# Patient Record
Sex: Female | Born: 1970 | Race: White | Hispanic: No | Smoking: Never smoker
Health system: Southern US, Community
[De-identification: ages and names within clinical notes are randomized; demographics above are authoritative.]

## PROBLEM LIST (undated history)

## (undated) DIAGNOSIS — I2542 Coronary artery dissection: Secondary | ICD-10-CM

## (undated) HISTORY — DX: Coronary artery dissection: I25.42

---

## 2008-07-01 ENCOUNTER — Emergency Department (HOSPITAL_BASED_OUTPATIENT_CLINIC_OR_DEPARTMENT_OTHER): Admission: EM | Admit: 2008-07-01 | Discharge: 2008-07-01 | Payer: Self-pay | Admitting: Emergency Medicine

## 2008-07-01 ENCOUNTER — Ambulatory Visit: Payer: Self-pay | Admitting: Radiology

## 2010-05-28 IMAGING — CT CT HEAD W/O CM
2 series · 16 of 30 positions shown, 18 images · non-contrast
Comparison: No priors

CLINICAL DATA: Syncope

CT HEAD WITHOUT CONTRAST
TECHNIQUE: Contiguous axial images were obtained from the base of
the skull through the vertex without contrast.

[Series 2: head 4.8 h37s · axial · 0.41mm/px · z∈[+1282,+1396]mm · 8 of 32 slices shown, 10 images]
[im 4/32  brain]
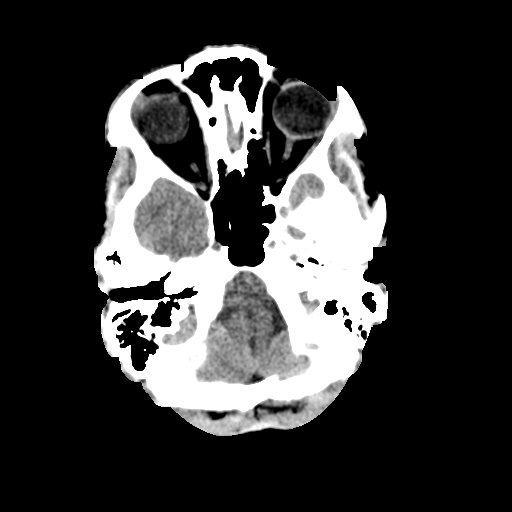
[im 4/32  bone]
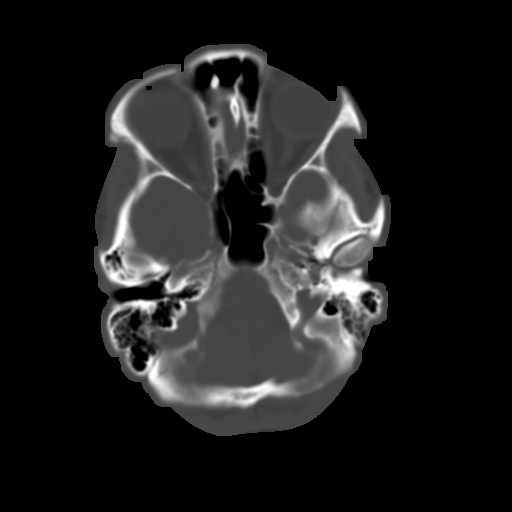
[im 7/32  brain]
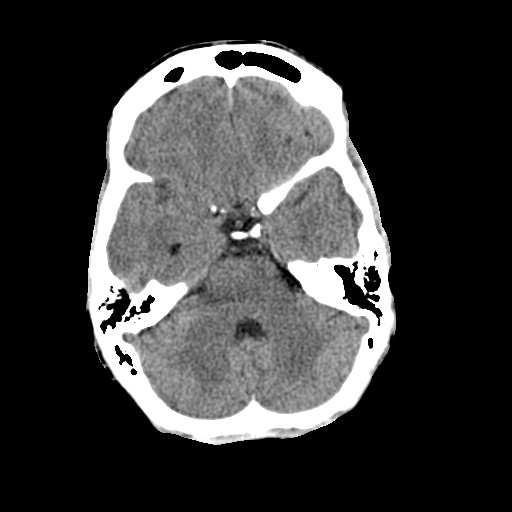
[im 11/32  brain]
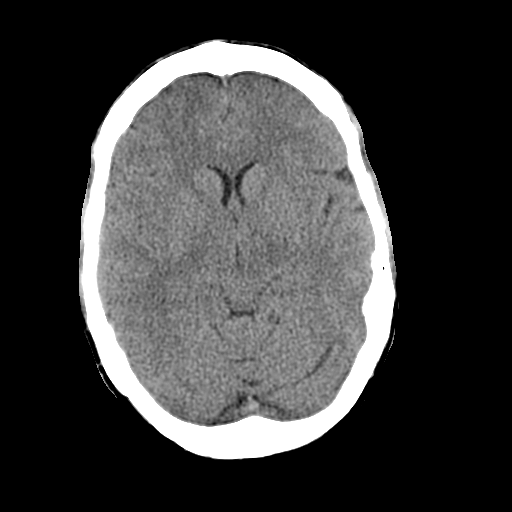
[im 14/32  brain]
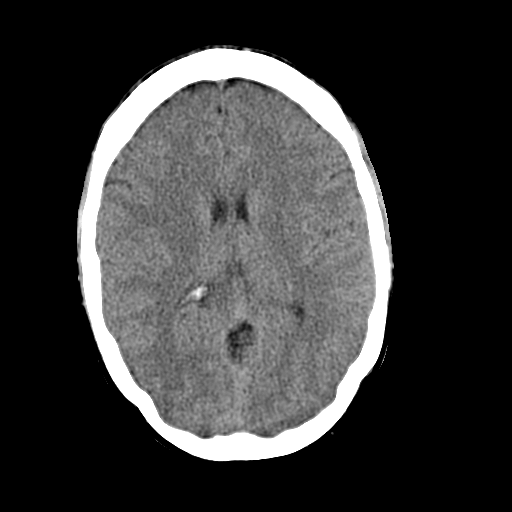
[im 18/32  brain]
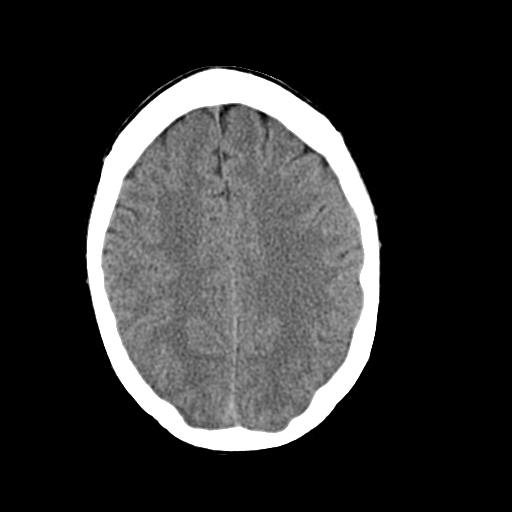
[im 18/32  bone]
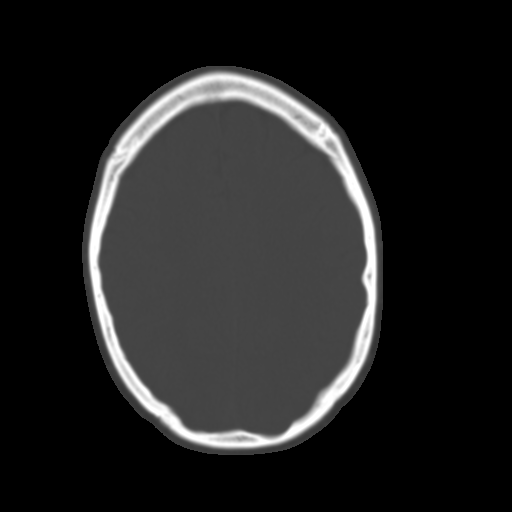
[im 21/32  brain]
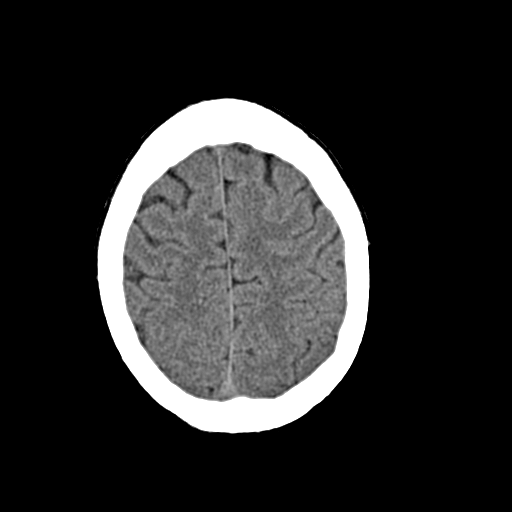
[im 25/32  brain]
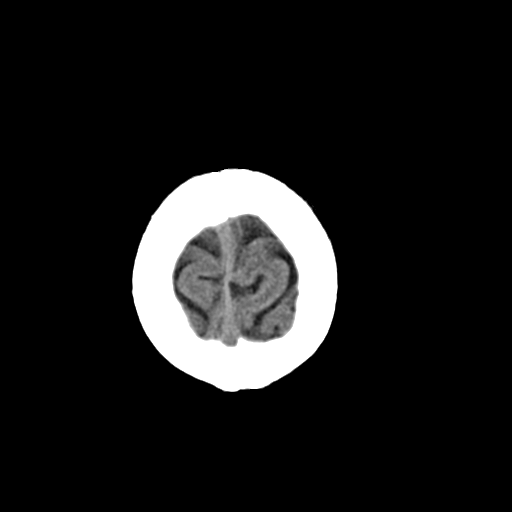
[im 28/32  brain]
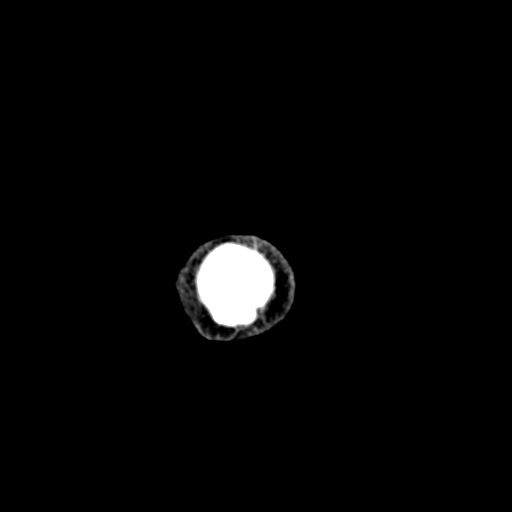

[Series 3: head 2.4 h60s bone · axial · 0.41mm/px · z∈[+1280,+1399]mm · 8 of 64 slices shown]
[im 7/64  bone]
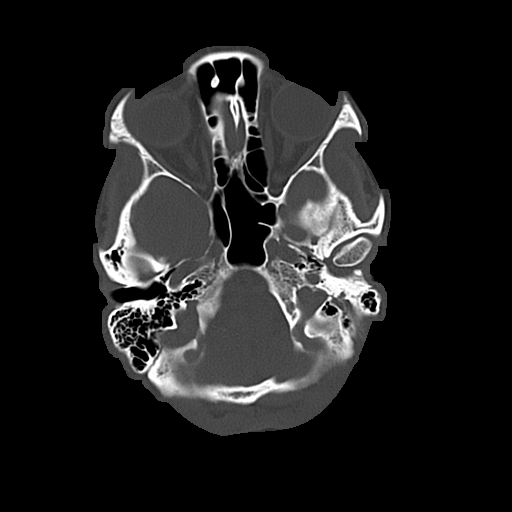
[im 14/64  bone]
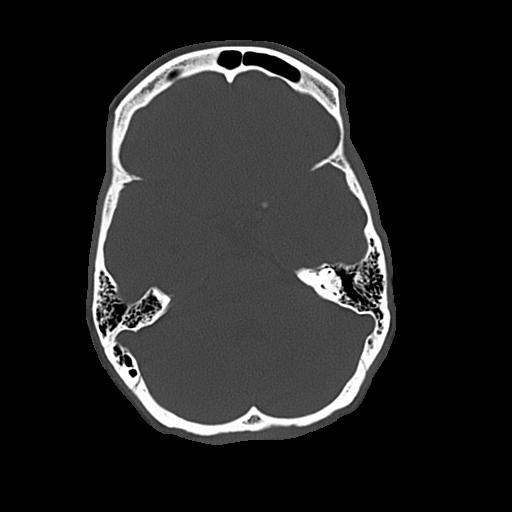
[im 20/64  bone]
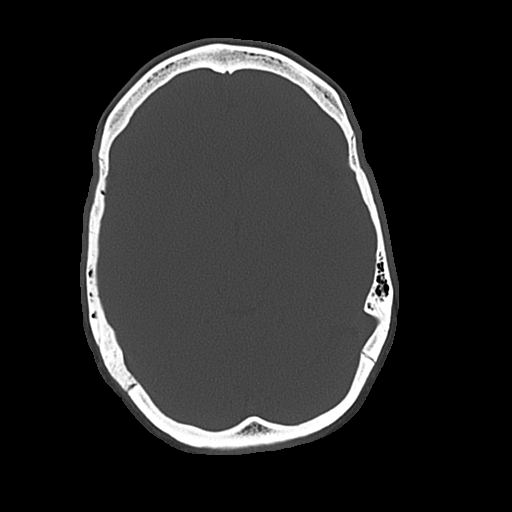
[im 27/64  bone]
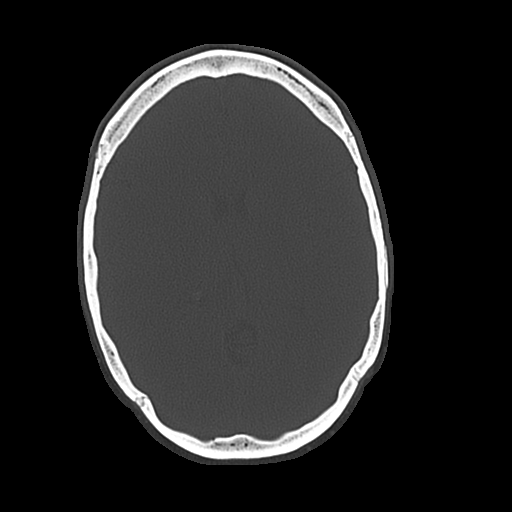
[im 37/64  bone]
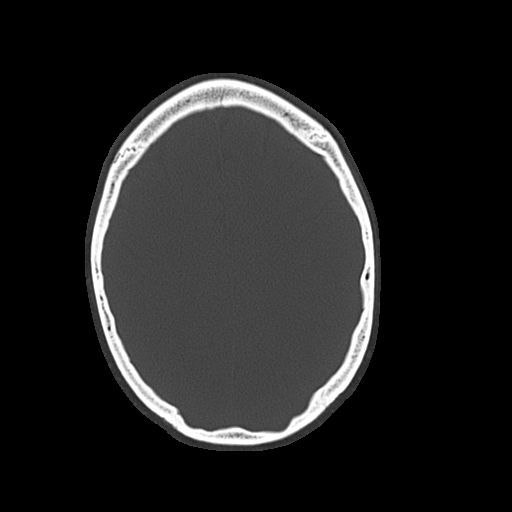
[im 44/64  bone]
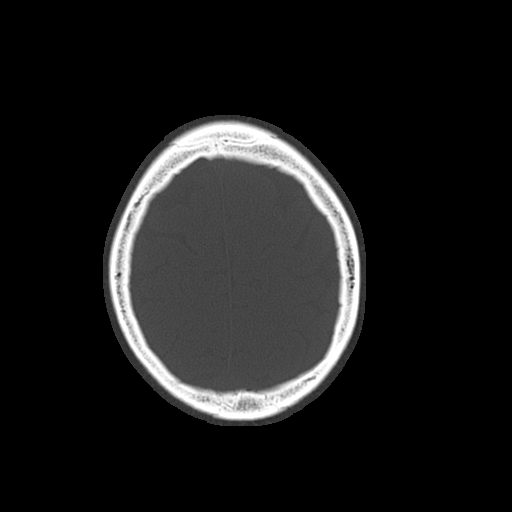
[im 50/64  bone]
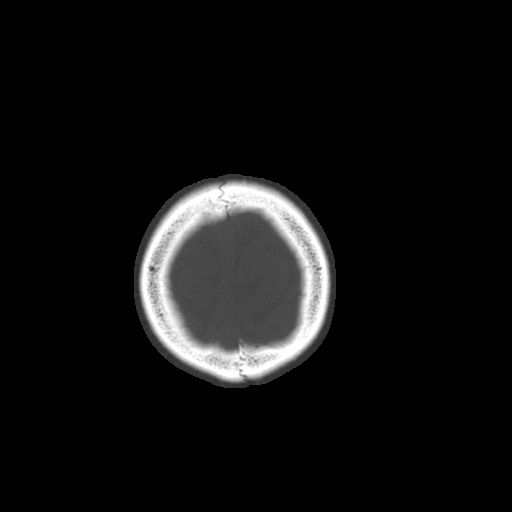
[im 57/64  bone]
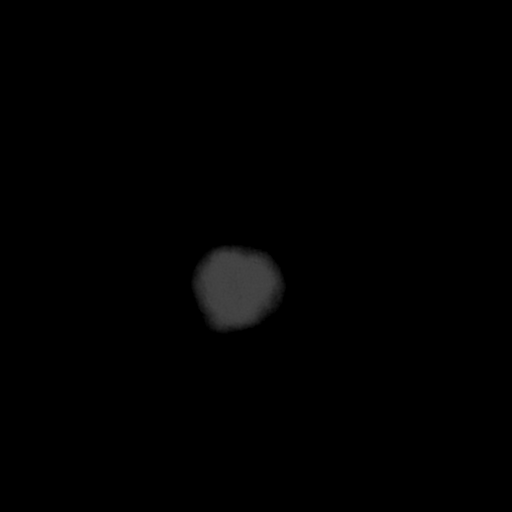

[16 of 30 positions shown; findings below may reference images not displayed]

FINDINGS: Ventricular size and CSF spaces normal.  No acute
infarct, bleed, or mass effect.

Calvarium intact.  Changes of chronic sinusitis are noted.
IMPRESSION: 1.  No acute or focal intracranial abnormality.
2.  Chronic sinusitis.

## 2011-04-19 LAB — BASIC METABOLIC PANEL
BUN: 12 mg/dL (ref 6–23)
Creatinine, Ser: 0.7 mg/dL (ref 0.4–1.2)
GFR calc non Af Amer: >60 mL/min (ref >60)
Glucose, Bld: 91 mg/dL (ref 70–99)
Potassium: 3.7 mEq/L (ref 3.5–5.1)

## 2011-04-19 LAB — DIFFERENTIAL
Basophils Absolute: 0 10*3/uL (ref 0.0–0.1)
Eosinophils Absolute: 0.1 10*3/uL (ref 0.0–0.7)
Eosinophils Relative: 2 % (ref 0–5)
Neutrophils Relative %: 56 % (ref 43–77)

## 2011-04-19 LAB — POCT CARDIAC MARKERS: Troponin i, poc: <0.05 ng/mL (ref 0.00–0.09)

## 2011-04-19 LAB — CBC
HCT: 37.9 % (ref 36.0–46.0)
MCV: 84.5 fL (ref 78.0–100.0)
Platelets: 157 10*3/uL (ref 150–400)
RDW: 13.9 % (ref 11.5–15.5)

## 2011-04-19 LAB — PREGNANCY, URINE: Preg Test, Ur: NEGATIVE

## 2011-04-19 LAB — URINALYSIS, ROUTINE W REFLEX MICROSCOPIC
Bilirubin Urine: NEGATIVE
Ketones, ur: NEGATIVE mg/dL
Nitrite: NEGATIVE
Protein, ur: NEGATIVE mg/dL
pH: 6 (ref 5.0–8.0)

## 2011-04-19 LAB — HCG, SERUM, QUALITATIVE: Preg, Serum: NEGATIVE

## 2011-04-19 LAB — D-DIMER, QUANTITATIVE: D-Dimer, Quant: <0.22 ug/mL-FEU (ref 0.00–0.48)

## 2020-10-01 ENCOUNTER — Encounter (HOSPITAL_COMMUNITY)
Admission: EM | Disposition: A | Discharge status: Home / Self Care | Payer: Self-pay | Source: Home / Self Care | Attending: Cardiology

## 2020-10-01 ENCOUNTER — Encounter (HOSPITAL_BASED_OUTPATIENT_CLINIC_OR_DEPARTMENT_OTHER): Payer: Self-pay | Admitting: Emergency Medicine

## 2020-10-01 ENCOUNTER — Other Ambulatory Visit: Payer: Self-pay

## 2020-10-01 ENCOUNTER — Emergency Department (HOSPITAL_BASED_OUTPATIENT_CLINIC_OR_DEPARTMENT_OTHER): Payer: No Typology Code available for payment source

## 2020-10-01 ENCOUNTER — Inpatient Hospital Stay (HOSPITAL_BASED_OUTPATIENT_CLINIC_OR_DEPARTMENT_OTHER)
Admission: EM | Admit: 2020-10-01 | Discharge: 2020-10-03 | DRG: 280 | Disposition: A | Discharge status: Home / Self Care | Payer: No Typology Code available for payment source | Attending: Cardiology | Admitting: Cardiology

## 2020-10-01 DIAGNOSIS — Z630 Problems in relationship with spouse or partner: Secondary | ICD-10-CM | POA: Diagnosis not present

## 2020-10-01 DIAGNOSIS — I2542 Coronary artery dissection: Secondary | ICD-10-CM | POA: Diagnosis present

## 2020-10-01 DIAGNOSIS — I2119 ST elevation (STEMI) myocardial infarction involving other coronary artery of inferior wall: Principal | ICD-10-CM | POA: Diagnosis present

## 2020-10-01 DIAGNOSIS — E785 Hyperlipidemia, unspecified: Secondary | ICD-10-CM | POA: Diagnosis present

## 2020-10-01 DIAGNOSIS — R7303 Prediabetes: Secondary | ICD-10-CM | POA: Diagnosis present

## 2020-10-01 DIAGNOSIS — Z20822 Contact with and (suspected) exposure to covid-19: Secondary | ICD-10-CM | POA: Diagnosis present

## 2020-10-01 DIAGNOSIS — Z9889 Other specified postprocedural states: Secondary | ICD-10-CM | POA: Diagnosis not present

## 2020-10-01 DIAGNOSIS — Z8249 Family history of ischemic heart disease and other diseases of the circulatory system: Secondary | ICD-10-CM | POA: Diagnosis not present

## 2020-10-01 DIAGNOSIS — I2510 Atherosclerotic heart disease of native coronary artery without angina pectoris: Secondary | ICD-10-CM | POA: Diagnosis present

## 2020-10-01 DIAGNOSIS — I213 ST elevation (STEMI) myocardial infarction of unspecified site: Secondary | ICD-10-CM | POA: Diagnosis present

## 2020-10-01 DIAGNOSIS — I2102 ST elevation (STEMI) myocardial infarction involving left anterior descending coronary artery: Secondary | ICD-10-CM | POA: Diagnosis not present

## 2020-10-01 HISTORY — PX: LEFT HEART CATH AND CORONARY ANGIOGRAPHY: CATH118249

## 2020-10-01 LAB — CBC
HCT: 35.2 % — ABNORMAL LOW (ref 36.0–46.0)
Hemoglobin: 11.8 g/dL — ABNORMAL LOW (ref 12.0–15.0)
MCH: 29 pg (ref 26.0–34.0)
MCHC: 33.5 g/dL (ref 30.0–36.0)
MCV: 86.5 fL (ref 80.0–100.0)
Platelets: 182 10*3/uL (ref 150–400)
RBC: 4.07 MIL/uL (ref 3.87–5.11)
RDW: 13.2 % (ref 11.5–15.5)
WBC: 7.1 10*3/uL (ref 4.0–10.5)
nRBC: 0 % (ref 0.0–0.2)

## 2020-10-01 LAB — COMPREHENSIVE METABOLIC PANEL
ALT: 14 U/L (ref 0–44)
AST: 18 U/L (ref 15–41)
Albumin: 4.1 g/dL (ref 3.5–5.0)
Alkaline Phosphatase: 41 U/L (ref 38–126)
Anion gap: 14 (ref 5–15)
BUN: 11 mg/dL (ref 6–20)
CO2: 24 mmol/L (ref 22–32)
Calcium: 9 mg/dL (ref 8.9–10.3)
Chloride: 100 mmol/L (ref 98–111)
Creatinine, Ser: 0.8 mg/dL (ref 0.44–1.00)
GFR, Estimated: >60 mL/min (ref >60)
Glucose, Bld: 135 mg/dL — ABNORMAL HIGH (ref 70–99)
Potassium: 3.6 mmol/L (ref 3.5–5.1)
Sodium: 138 mmol/L (ref 135–145)
Total Bilirubin: 0.1 mg/dL — ABNORMAL LOW (ref 0.3–1.2)
Total Protein: 7.1 g/dL (ref 6.5–8.1)

## 2020-10-01 LAB — CBC WITH DIFFERENTIAL/PLATELET
Abs Immature Granulocytes: 0.02 10*3/uL (ref 0.00–0.07)
Basophils Absolute: 0 10*3/uL (ref 0.0–0.1)
Basophils Relative: 1 %
Eosinophils Absolute: 0.1 10*3/uL (ref 0.0–0.5)
Eosinophils Relative: 1 %
HCT: 41.3 % (ref 36.0–46.0)
Hemoglobin: 13.7 g/dL (ref 12.0–15.0)
Immature Granulocytes: 0 %
Lymphocytes Relative: 36 %
Lymphs Abs: 2.3 10*3/uL (ref 0.7–4.0)
MCH: 28.8 pg (ref 26.0–34.0)
MCHC: 33.2 g/dL (ref 30.0–36.0)
MCV: 86.8 fL (ref 80.0–100.0)
Monocytes Absolute: 0.5 10*3/uL (ref 0.1–1.0)
Monocytes Relative: 8 %
Neutro Abs: 3.4 10*3/uL (ref 1.7–7.7)
Neutrophils Relative %: 54 %
Platelets: 235 10*3/uL (ref 150–400)
RBC: 4.76 MIL/uL (ref 3.87–5.11)
RDW: 13.2 % (ref 11.5–15.5)
WBC: 6.3 10*3/uL (ref 4.0–10.5)
nRBC: 0 % (ref 0.0–0.2)

## 2020-10-01 LAB — PROTIME-INR
INR: 1 (ref 0.8–1.2)
Prothrombin Time: 12.5 seconds (ref 11.4–15.2)

## 2020-10-01 LAB — RESP PANEL BY RT-PCR (FLU A&B, COVID) ARPGX2
Influenza A by PCR: NEGATIVE
Influenza B by PCR: NEGATIVE
SARS Coronavirus 2 by RT PCR: NEGATIVE

## 2020-10-01 LAB — HEMOGLOBIN A1C
Hgb A1c MFr Bld: 5.7 % — ABNORMAL HIGH (ref 4.8–5.6)
Mean Plasma Glucose: 116.89 mg/dL

## 2020-10-01 LAB — TROPONIN I (HIGH SENSITIVITY): Troponin I (High Sensitivity): 9 ng/L (ref <18)

## 2020-10-01 LAB — APTT: aPTT: 26 seconds (ref 24–36)

## 2020-10-01 SURGERY — LEFT HEART CATH AND CORONARY ANGIOGRAPHY
Anesthesia: LOCAL

## 2020-10-01 MED ORDER — SODIUM CHLORIDE 0.9 % WEIGHT BASED INFUSION
1.0000 mL/kg/h | INTRAVENOUS | Status: DC
  Administered 2020-10-01: 1 mL/kg/h via INTRAVENOUS

## 2020-10-01 MED ORDER — HEPARIN (PORCINE) IN NACL 1000-0.9 UT/500ML-% IV SOLN
INTRAVENOUS | Status: DC | PRN
  Administered 2020-10-01 (×2): 500 mL

## 2020-10-01 MED ORDER — IOHEXOL 350 MG/ML SOLN
INTRAVENOUS | Status: AC
  Filled 2020-10-01: qty 1

## 2020-10-01 MED ORDER — ENOXAPARIN SODIUM 40 MG/0.4ML ~~LOC~~ SOLN: 40.0000 mg | SUBCUTANEOUS | Status: DC

## 2020-10-01 MED ORDER — METOPROLOL TARTRATE 25 MG PO TABS
25.0000 mg | ORAL_TABLET | Freq: Two times a day (BID) | ORAL | Status: DC
  Administered 2020-10-01: 25 mg via ORAL
  Filled 2020-10-01: qty 1

## 2020-10-01 MED ORDER — MIDAZOLAM HCL 2 MG/2ML IJ SOLN
INTRAMUSCULAR | Status: DC | PRN
  Administered 2020-10-01: 1 mg via INTRAVENOUS

## 2020-10-01 MED ORDER — MIDAZOLAM HCL 2 MG/2ML IJ SOLN
INTRAMUSCULAR | Status: AC
  Filled 2020-10-01: qty 2

## 2020-10-01 MED ORDER — SODIUM CHLORIDE 0.9 % IV SOLN
INTRAVENOUS | Status: AC | PRN
  Administered 2020-10-01: 20 mL/h via INTRAVENOUS

## 2020-10-01 MED ORDER — FENTANYL CITRATE (PF) 100 MCG/2ML IJ SOLN
INTRAMUSCULAR | Status: AC
  Filled 2020-10-01: qty 2

## 2020-10-01 MED ORDER — LIDOCAINE HCL (PF) 1 % IJ SOLN
INTRAMUSCULAR | Status: AC
  Filled 2020-10-01: qty 30

## 2020-10-01 MED ORDER — VERAPAMIL HCL 2.5 MG/ML IV SOLN
INTRAVENOUS | Status: AC
  Filled 2020-10-01: qty 2

## 2020-10-01 MED ORDER — SODIUM CHLORIDE 0.9 % IV SOLN: INTRAVENOUS | Status: DC

## 2020-10-01 MED ORDER — NITROGLYCERIN 0.4 MG SL SUBL: 0.4000 mg | SUBLINGUAL_TABLET | SUBLINGUAL | Status: DC | PRN

## 2020-10-01 MED ORDER — SODIUM CHLORIDE 0.9% FLUSH
3.0000 mL | Freq: Two times a day (BID) | INTRAVENOUS | Status: DC
  Administered 2020-10-02 – 2020-10-03 (×3): 3 mL via INTRAVENOUS

## 2020-10-01 MED ORDER — FENTANYL CITRATE (PF) 100 MCG/2ML IJ SOLN
INTRAMUSCULAR | Status: DC | PRN
  Administered 2020-10-01: 25 ug via INTRAVENOUS

## 2020-10-01 MED ORDER — HEPARIN SODIUM (PORCINE) 5000 UNIT/ML IJ SOLN
4000.0000 [IU] | Freq: Once | INTRAMUSCULAR | Status: AC
  Administered 2020-10-01: 4000 [IU] via INTRAVENOUS
  Filled 2020-10-01: qty 1

## 2020-10-01 MED ORDER — IOHEXOL 350 MG/ML SOLN
INTRAVENOUS | Status: DC | PRN
  Administered 2020-10-01: 60 mL via INTRA_ARTERIAL

## 2020-10-01 MED ORDER — ONDANSETRON HCL 4 MG/2ML IJ SOLN: 4.0000 mg | Freq: Four times a day (QID) | INTRAMUSCULAR | Status: DC | PRN

## 2020-10-01 MED ORDER — ATORVASTATIN CALCIUM 40 MG PO TABS
40.0000 mg | ORAL_TABLET | Freq: Every day | ORAL | Status: DC
  Administered 2020-10-02 – 2020-10-03 (×2): 40 mg via ORAL
  Filled 2020-10-01 (×2): qty 1

## 2020-10-01 MED ORDER — SODIUM CHLORIDE 0.9 % IV SOLN: 250.0000 mL | INTRAVENOUS | Status: DC | PRN

## 2020-10-01 MED ORDER — HEPARIN (PORCINE) IN NACL 1000-0.9 UT/500ML-% IV SOLN
INTRAVENOUS | Status: AC
  Filled 2020-10-01: qty 1000

## 2020-10-01 MED ORDER — NITROGLYCERIN 0.4 MG/SPRAY TL SOLN: 1.0000 | Status: DC | PRN

## 2020-10-01 MED ORDER — VERAPAMIL HCL 2.5 MG/ML IV SOLN
INTRAVENOUS | Status: DC | PRN
  Administered 2020-10-01: 10 mL via INTRA_ARTERIAL

## 2020-10-01 MED ORDER — ASPIRIN 81 MG PO CHEW
324.0000 mg | CHEWABLE_TABLET | Freq: Once | ORAL | Status: AC
  Administered 2020-10-01: 324 mg via ORAL
  Filled 2020-10-01: qty 4

## 2020-10-01 MED ORDER — SODIUM CHLORIDE 0.9% FLUSH: 3.0000 mL | INTRAVENOUS | Status: DC | PRN

## 2020-10-01 MED ORDER — ASPIRIN 81 MG PO CHEW
81.0000 mg | CHEWABLE_TABLET | Freq: Every day | ORAL | Status: DC
  Administered 2020-10-02 – 2020-10-03 (×2): 81 mg via ORAL
  Filled 2020-10-01 (×2): qty 1

## 2020-10-01 MED ORDER — LIDOCAINE HCL (PF) 1 % IJ SOLN
INTRAMUSCULAR | Status: DC | PRN
  Administered 2020-10-01: 5 mL via SUBCUTANEOUS

## 2020-10-01 MED ORDER — ACETAMINOPHEN 325 MG PO TABS: 650.0000 mg | ORAL_TABLET | ORAL | Status: DC | PRN

## 2020-10-01 SURGICAL SUPPLY — 11 items
CATH 5FR JL3.5 JR4 ANG PIG MP (CATHETERS) ×2 IMPLANT
DEVICE RAD COMP TR BAND LRG (VASCULAR PRODUCTS) ×2 IMPLANT
GLIDESHEATH SLEND SS 6F .021 (SHEATH) ×2 IMPLANT
GUIDEWIRE INQWIRE 1.5J.035X260 (WIRE) ×1 IMPLANT
INQWIRE 1.5J .035X260CM (WIRE) ×2
KIT ENCORE 26 ADVANTAGE (KITS) ×2 IMPLANT
KIT HEART LEFT (KITS) ×2 IMPLANT
PACK CARDIAC CATHETERIZATION (CUSTOM PROCEDURE TRAY) ×2 IMPLANT
SHEATH PROBE COVER 6X72 (BAG) ×2 IMPLANT
TRANSDUCER W/STOPCOCK (MISCELLANEOUS) ×2 IMPLANT
TUBING CIL FLEX 10 FLL-RA (TUBING) ×2 IMPLANT

## 2020-10-01 NOTE — ED Triage Notes (Signed)
Pt bib [facility] from [facility] for STEMI. Per [facility], pt has already received heparin and aspirin PTA. COVID test pending. Pt not complainin gof discomfort on arrival. Cath lab aware

## 2020-10-01 NOTE — H&P (Signed)
Cardiology Admission History and Physical:   Patient ID: Haley Morton MRN: [id number]; DOB: [birth year]   Admission date: 10/01/2020  PCP:  Patient, No Pcp Per   [facility]  Cardiologist:  No primary care provider on file. new Advanced Practice Provider:  No care team member to display Electrophysiologist:  None    Chief Complaint:  Chest pain  Patient Profile:   Haley Morton is a 50 y.o. female with Acute inferior STEMI  History of Present Illness:   Haley Morton is a 50 yo WF in excellent health. Presented to [facility] tonight with acute onset mid sternal chest pain with tingling radiating to her arms. Some nausea. No SOB or diaphoresis. Was at a friends house and drove to ED. Initial Ecg shows significant ST elevation in the inferolateral leads. Given ASA, IV heparin. Chest pain then resolved with resolution of ST elevation. Reports she has been under a lot of stress with marital issues. No history of HTN, HLD, DM, tobacco or drug use. No pertinent medical history.    History reviewed. No pertinent past medical history.  History reviewed. No pertinent surgical history.   Medications Prior to Admission: Prior to Admission medications   Not on File     Allergies:   No Known Allergies  Social History:   Social History   Socioeconomic History  . Marital status: Married    Spouse name: Not on file  . Number of children: Not on file  . Years of education: Not on file  . Highest education level: Not on file  Occupational History  . Not on file  Tobacco Use  . Smoking status: Not on file  . Smokeless tobacco: Not on file  Substance and Sexual Activity  . Alcohol use: Not on file  . Drug use: Not on file  . Sexual activity: Not on file  Other Topics Concern  . Not on file  Social History Narrative  . Not on file   Social Determinants of Health   Financial Resource Strain: Not on file  Food Insecurity: Not on file  Transportation  Needs: Not on file  Physical Activity: Not on file  Stress: Not on file  Social Connections: Not on file  Intimate Partner Violence: Not on file    Family History:  Maternal uncle with history of heart disease The patient's family history is not on file.    ROS:  Please see the history of present illness.  All other ROS reviewed and negative.     Physical Exam/Data:   Vitals:   10/01/20 2156 10/01/20 2201 10/01/20 2204 10/01/20 2209  BP: 115/67 114/66 (!) 117/54   Pulse: 77 77 70 72  Resp: 17 20 13 13   Temp:      TempSrc:      SpO2: 100% 100% 100% 100%   No intake or output data in the 24 hours ending 10/01/20 2229 No flowsheet data found.   There is no height or weight on file to calculate BMI.  General:  Well nourished, well developed, in no acute distress HEENT: normal Lymph: no adenopathy Neck: no JVD Endocrine:  No thryomegaly Vascular: No carotid bruits; FA pulses 2+ bilaterally without bruits  Cardiac:  normal S1, S2; RRR; no murmur  Lungs:  clear to auscultation bilaterally, no wheezing, rhonchi or rales  Abd: soft, nontender, no hepatomegaly  Ext: no edema Musculoskeletal:  No deformities, BUE and BLE strength normal and equal Skin: warm and dry  Neuro:  CNs 2-12 intact, no focal abnormalities noted Psych:  Normal affect    EKG:  The ECG that was done today was personally reviewed and demonstrates ST elevation in 2,3,AVF and V3-6. Repeat Ecg with resolution of chest pain is normal.  Relevant CV Studies: None   Laboratory Data:  High Sensitivity Troponin:   Recent Labs  Lab 10/01/20 2046  TROPONINIHS 9      Chemistry Recent Labs  Lab 10/01/20 2047  NA 138  K 3.6  CL 100  CO2 24  GLUCOSE 135*  BUN 11  CREATININE 0.80  CALCIUM 9.0  GFRNONAA >60  ANIONGAP 14    Recent Labs  Lab 10/01/20 2047  PROT 7.1  ALBUMIN 4.1  AST 18  ALT 14  ALKPHOS 41  BILITOT 0.1*   Hematology Recent Labs  Lab 10/01/20 2047  WBC 6.3  RBC 4.76  HGB  13.7  HCT 41.3  MCV 86.8  MCH 28.8  MCHC 33.2  RDW 13.2  PLT 235   BNPNo results for input(s): BNP, PROBNP in the last 168 hours.  DDimer No results for input(s): DDIMER in the last 168 hours.   Radiology/Studies:  CARDIAC CATHETERIZATION  Result Date: 10/01/2020  Mid LAD to Dist LAD lesion is 50% stenosed.  The left ventricular systolic function is normal.  LV end diastolic pressure is normal.  The left ventricular ejection fraction is 50-55% by visual estimate.  1. 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 Now with TIMI 3 flow. 2. Low normal LV function with apical hypokinesis. 3. Normal LVEDP Plan: will manage medically with ASA, statin, beta blocker. DVT prophylaxis. Observe closely in ICU tonight.   DG Chest Port 1 View  Result Date: 10/01/2020 CLINICAL DATA:  Chest pain EXAM: PORTABLE CHEST 1 VIEW COMPARISON:  07/01/2008 FINDINGS: The heart size and mediastinal contours are within normal limits. Both lungs are clear. The visualized skeletal structures are unremarkable. IMPRESSION: Negative Electronically Signed   By: Charlett Nose M.D.   On: 10/01/2020 21:12     Assessment and Plan:   1. Aborted STEMI. Patient presented with acute chest pain and ST elevation. Pain resolved and Ecg normalized. Cardiac cath is consistent with SCAD in the Mid LAD with TIMI 3 flow.  Will treat with ASA, beta blocker and statin DVT prophylaxis.  Echo.    Risk Assessment/Risk Scores:     TIMI Risk Score for ST  Elevation MI:   The patient's TIMI risk score is 1, which indicates a 1.6% risk of all cause mortality at 30 days.      Severity of Illness: The appropriate patient status for this patient is INPATIENT. Inpatient status is judged to be reasonable and necessary in order to provide the required intensity of service to ensure the patient's safety. The patient's presenting symptoms, physical exam findings, and initial radiographic  and laboratory data in the context of their chronic comorbidities is felt to place them at high risk for further clinical deterioration. Furthermore, it is not anticipated that the patient will be medically stable for discharge from the hospital within 2 midnights of admission. The following factors support the patient status of inpatient.   " The patient's presenting symptoms include acute chest pain. " The worrisome physical exam findings include none. " The initial radiographic and laboratory data are worrisome because of ST elevation on Ecg. " The chronic co-morbidities include none.   * I certify that at the point of admission  it is my clinical judgment that the patient will require inpatient hospital care spanning beyond 2 midnights from the point of admission due to high intensity of service, high risk for further deterioration and high frequency of surveillance required.*    For questions or updates, please contact [facility] Please consult [email or web address] for contact info under     Signed, Laddie Math [place], MD  10/01/2020 10:29 PM

## 2020-10-01 NOTE — ED Triage Notes (Signed)
Chest pain onset approx 20 minutes ago, states feeling cold and unable to move hands, right arm pain at onset. Pt reports improvement already.

## 2020-10-01 NOTE — ED Provider Notes (Signed)
[facility] EMERGENCY DEPARTMENT Provider Note   CSN: [id number] Arrival date & time: 10/01/20  2032     History Chief Complaint  Patient presents with  . Chest Pain  . Code STEMI    Haley Morton is a 50 y.o. female.  The history is provided by the patient and the spouse.   Haley Morton is a 50 y.o. female who presents to the Emergency Department complaining of chest pain. She presents the emergency department for evaluation of central chest pain that radiates to bilateral upper extremities, left greater than right. Pain is described as a pressure sensation and started shortly prior to ED arrival. She denies any fevers, shortness of breath, diaphoresis. No prior similar symptoms. She has no known medical problems and takes no medications. She does not smoke, drink alcohol or use treat drugs. Significant family history of coronary artery disease.     History reviewed. No pertinent past medical history.  Patient Active Problem List   Diagnosis Date Noted  . STEMI (ST elevation myocardial infarction) (HCC) 10/01/2020  . Acute ST elevation myocardial infarction (STEMI) due to occlusion of left anterior descending (LAD) coronary artery (HCC) 10/01/2020  . STEMI involving left anterior descending coronary artery (HCC) 10/01/2020    History reviewed. No pertinent surgical history.   OB History   No obstetric history on file.     No family history on file.     Home Medications Prior to Admission medications   Not on File    Allergies    Patient has no known allergies.  Review of Systems   Review of Systems  All other systems reviewed and are negative.   Physical Exam Updated Vital Signs BP (!) 117/57   Pulse 80   Temp 98.4 F (36.9 C) (Oral)   Resp 12   Wt 50.2 kg   LMP 09/24/2020 (Approximate)   SpO2 97%   Physical Exam Vitals and nursing note reviewed.  Constitutional:      General: She is in acute distress.     Appearance: She is  well-developed.  HENT:     Head: Normocephalic and atraumatic.  Cardiovascular:     Rate and Rhythm: Normal rate and regular rhythm.     Heart sounds: No murmur heard.   Pulmonary:     Effort: Pulmonary effort is normal. No respiratory distress.     Breath sounds: Normal breath sounds.  Abdominal:     Palpations: Abdomen is soft.     Tenderness: There is no abdominal tenderness. There is no guarding or rebound.  Musculoskeletal:        General: No tenderness.  Skin:    General: Skin is warm and dry.  Neurological:     Mental Status: She is alert and oriented to person, place, and time.  Psychiatric:        Behavior: Behavior normal.     ED Results / Procedures / Treatments   Labs (all labs ordered are listed, but only abnormal results are displayed) Labs Reviewed  HEMOGLOBIN A1C - Abnormal; Notable for the following components:      Result Value   Hgb A1c MFr Bld 5.7 (*)    All other components within normal limits  COMPREHENSIVE METABOLIC PANEL - Abnormal; Notable for the following components:   Glucose, Bld 135 (*)    Total Bilirubin 0.1 (*)    All other components within normal limits  CBC - Abnormal; Notable for the following components:   Hemoglobin 11.8 (*)  HCT 35.2 (*)    All other components within normal limits  RESP PANEL BY RT-PCR (FLU A&B, COVID) ARPGX2  CBC WITH DIFFERENTIAL/PLATELET  PROTIME-INR  APTT  LIPID PANEL  PREGNANCY, URINE  HIV ANTIBODY (ROUTINE TESTING W REFLEX)  CREATININE, SERUM  TSH  TROPONIN I (HIGH SENSITIVITY)  TROPONIN I (HIGH SENSITIVITY)    EKG None  Radiology CARDIAC CATHETERIZATION  Result Date: 10/01/2020  Mid LAD to Dist LAD lesion is 50% stenosed.  The left ventricular systolic function is normal.  LV end diastolic pressure is normal.  The left ventricular ejection fraction is 50-55% by visual estimate.  1. 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 Now  with TIMI 3 flow. 2. Low normal LV function with apical hypokinesis. 3. Normal LVEDP Plan: will manage medically with ASA, statin, beta blocker. DVT prophylaxis. Observe closely in ICU tonight.   DG Chest Port 1 View  Result Date: 10/01/2020 CLINICAL DATA:  Chest pain EXAM: PORTABLE CHEST 1 VIEW COMPARISON:  07/01/2008 FINDINGS: The heart size and mediastinal contours are within normal limits. Both lungs are clear. The visualized skeletal structures are unremarkable. IMPRESSION: Negative Electronically Signed   By: Charlett Nose M.D.   On: 10/01/2020 21:12    Procedures Procedures  CRITICAL CARE Performed by: Tilden Fossa   Total critical care time: 35 minutes  Critical care time was exclusive of separately billable procedures and treating other patients.  Critical care was necessary to treat or prevent imminent or life-threatening deterioration.  Critical care was time spent personally by me on the following activities: development of treatment plan with patient and/or surrogate as well as nursing, discussions with consultants, evaluation of patient's response to treatment, examination of patient, obtaining history from patient or surrogate, ordering and performing treatments and interventions, ordering and review of laboratory studies, ordering and review of radiographic studies, pulse oximetry and re-evaluation of patient's condition.  Medications Ordered in ED Medications  0.9 %  sodium chloride infusion ( Intravenous New Bag/Given 10/01/20 2244)  nitroGLYCERIN (NITROSTAT) SL tablet 0.4 mg ( Sublingual MAR Unhold 10/01/20 2251)  acetaminophen (TYLENOL) tablet 650 mg (has no administration in time range)  ondansetron (ZOFRAN) injection 4 mg (has no administration in time range)  nitroGLYCERIN (NITROSTAT) SL tablet 0.4 mg (has no administration in time range)  enoxaparin (LOVENOX) injection 40 mg (has no administration in time range)  0.9% sodium chloride infusion (1 mL/kg/hr  50.2 kg  Intravenous New Bag/Given 10/01/20 2332)  sodium chloride flush (NS) 0.9 % injection 3 mL (has no administration in time range)  sodium chloride flush (NS) 0.9 % injection 3 mL (has no administration in time range)  0.9 %  sodium chloride infusion (has no administration in time range)  aspirin chewable tablet 81 mg (has no administration in time range)  metoprolol tartrate (LOPRESSOR) tablet 25 mg (25 mg Oral Given 10/01/20 2335)  atorvastatin (LIPITOR) tablet 40 mg (has no administration in time range)  aspirin chewable tablet 324 mg (324 mg Oral Given 10/01/20 2048)  heparin injection 4,000 Units (4,000 Units Intravenous Given 10/01/20 2048)  0.9 %  sodium chloride infusion (20 mL/hr Intravenous New Bag/Given 10/01/20 2143)    ED Course  I have reviewed the triage vital signs and the nursing notes.  Pertinent labs & imaging results that were available during my care of the patient were reviewed by me and considered in my medical decision making (see chart for details).    MDM Rules/Calculators/A&P  Patient here for evaluation of chest pain. EKG with ST elevation in the inferior lateral leads consistent with acute ST elevation MI. Cath Lab was emergently activated. Discussed with Dr. Peter [place]. Plan to transfer emergently to [facility] for further intervention and evaluation. Patient and husband updated of treatment plan.  Final Clinical Impression(s) / ED Diagnoses Final diagnoses:  ST elevation myocardial infarction (STEMI), unspecified artery [facility])    Rx / DC Orders ED Discharge Orders    None       Tilden Fossa, MD 10/02/20 0000

## 2020-10-01 NOTE — ED Notes (Signed)
Radiolucent pads placed on patient and taken to cath lab with RN and EMT.

## 2020-10-02 ENCOUNTER — Inpatient Hospital Stay (HOSPITAL_COMMUNITY): Payer: No Typology Code available for payment source

## 2020-10-02 ENCOUNTER — Encounter (HOSPITAL_COMMUNITY): Payer: Self-pay | Admitting: Cardiology

## 2020-10-02 ENCOUNTER — Encounter (HOSPITAL_COMMUNITY): Payer: No Typology Code available for payment source

## 2020-10-02 DIAGNOSIS — I2542 Coronary artery dissection: Secondary | ICD-10-CM

## 2020-10-02 DIAGNOSIS — I2102 ST elevation (STEMI) myocardial infarction involving left anterior descending coronary artery: Secondary | ICD-10-CM

## 2020-10-02 DIAGNOSIS — Z9889 Other specified postprocedural states: Secondary | ICD-10-CM

## 2020-10-02 DIAGNOSIS — R7303 Prediabetes: Secondary | ICD-10-CM

## 2020-10-02 LAB — LIPID PANEL
Cholesterol: 214 mg/dL — ABNORMAL HIGH (ref 0–200)
Cholesterol: 179 mg/dL (ref 0–200)
HDL: 110 mg/dL (ref >40)
HDL: 89 mg/dL (ref >40)
LDL Cholesterol: 90 mg/dL (ref 0–99)
LDL Cholesterol: 80 mg/dL (ref 0–99)
Total CHOL/HDL Ratio: 1.9 RATIO
Total CHOL/HDL Ratio: 2 RATIO
Triglycerides: 69 mg/dL (ref <150)
Triglycerides: 51 mg/dL (ref <150)
VLDL: 14 mg/dL (ref 0–40)
VLDL: 10 mg/dL (ref 0–40)

## 2020-10-02 LAB — ECHOCARDIOGRAM COMPLETE
AR max vel: 1.49 cm2
AV Area VTI: 1.41 cm2
AV Area mean vel: 1.38 cm2
AV Mean grad: 4 mmHg
AV Peak grad: 7.7 mmHg
Ao pk vel: 1.39 m/s
Area-P 1/2: 3.65 cm2
P 1/2 time: 751 msec
S' Lateral: 3.1 cm
Single Plane A4C EF: 62.5 %
Weight: 1770.73 oz

## 2020-10-02 LAB — TSH: TSH: 1.263 u[IU]/mL (ref 0.350–4.500)

## 2020-10-02 LAB — CREATININE, SERUM
Creatinine, Ser: 0.67 mg/dL (ref 0.44–1.00)
GFR, Estimated: >60 mL/min (ref >60)

## 2020-10-02 LAB — TROPONIN I (HIGH SENSITIVITY)
Troponin I (High Sensitivity): 1099 ng/L
Troponin I (High Sensitivity): 1775 ng/L (ref <18)

## 2020-10-02 LAB — HIV ANTIBODY (ROUTINE TESTING W REFLEX): HIV Screen 4th Generation wRfx: NONREACTIVE

## 2020-10-02 LAB — MRSA PCR SCREENING: MRSA by PCR: NEGATIVE

## 2020-10-02 MED ORDER — ENOXAPARIN SODIUM 40 MG/0.4ML ~~LOC~~ SOLN: 40.0000 mg | SUBCUTANEOUS | Status: DC

## 2020-10-02 MED ORDER — METOPROLOL SUCCINATE ER 25 MG PO TB24
12.5000 mg | ORAL_TABLET | Freq: Every day | ORAL | Status: DC
  Administered 2020-10-02 – 2020-10-03 (×2): 12.5 mg via ORAL
  Filled 2020-10-02 (×2): qty 1

## 2020-10-02 MED ORDER — PERFLUTREN LIPID MICROSPHERE
1.0000 mL | INTRAVENOUS | Status: AC | PRN
  Administered 2020-10-02: 1 mL via INTRAVENOUS
  Filled 2020-10-02: qty 10

## 2020-10-02 MED ORDER — CHLORHEXIDINE GLUCONATE CLOTH 2 % EX PADS
6.0000 | MEDICATED_PAD | Freq: Every day | CUTANEOUS | Status: DC
  Administered 2020-10-02: 6 via TOPICAL

## 2020-10-02 NOTE — Plan of Care (Signed)

## 2020-10-02 NOTE — Progress Notes (Signed)
Carotid duplex has been completed.   Preliminary results in CV Proc.   Blanch Media 10/02/2020 2:23 PM

## 2020-10-02 NOTE — Progress Notes (Signed)
Progress Note  Patient Name: Haley Morton Date of Encounter: 10/02/2020  [facility] Cardiologist: No primary care provider on file. Peter [place]  Subjective   Asymptomatic this morning.  States she had chest discomfort for less than 40 minutes total.  Discomfort started while lying down.  She was under emotional stress.  She had just gotten home from church.  She does not want to be on any medications and does not seem to understand that this was a real cardiac event.  This will be reinforced.  She is concerned about returning to work rapidly, not been on a statin, and not wanting to be on any medication for blood pressure since she has never had that diagnosis.  Inpatient Medications    Scheduled Meds: . aspirin  81 mg Oral Daily  . atorvastatin  40 mg Oral Daily  . enoxaparin (LOVENOX) injection  40 mg Subcutaneous Q24H  . metoprolol tartrate  25 mg Oral BID  . sodium chloride flush  3 mL Intravenous Q12H   Continuous Infusions: . sodium chloride Stopped (10/02/20 0102)  . sodium chloride    . sodium chloride 1 mL/kg/hr (10/01/20 2332)   PRN Meds: sodium chloride, acetaminophen, nitroGLYCERIN, nitroGLYCERIN, ondansetron (ZOFRAN) IV, sodium chloride flush   Vital Signs    Vitals:   10/02/20 0400 10/02/20 0500 10/02/20 0600 10/02/20 0700  BP: (!) 113/58 114/61 125/69 107/63  Pulse: 66 72 77 62  Resp: 17 14 17 16   Temp:    98.6 F (37 C)  TempSrc:    Oral  SpO2: 97% 97% 98% 97%  Weight:        Intake/Output Summary (Last 24 hours) at 10/02/2020 0826 Last data filed at 10/02/2020 0700 Gross per 24 hour  Intake 773 ml  Output --  Net 773 ml   Last 3 Weights 10/01/2020  Weight (lbs) 110 lb 10.7 oz  Weight (kg) 50.2 kg      Telemetry    Normal sinus rhythm- Personally Reviewed  ECG    Poor R wave progression V1 through V4 with faint precordial biphasic T waves.  Marked improvement compared to initial EKG which revealed ST elevation V3 through V6 and also in  lead II, III, and aVF- Personally Reviewed  Physical Exam  Lying flat comfortable healthy-appearing GEN: No acute distress.   Neck: No JVD Cardiac: RRR, no murmurs, rubs, or gallops.  Respiratory: Clear to auscultation bilaterally. GI: Soft, nontender, non-distended  MS: No edema; No deformity. Neuro:  Nonfocal  Psych: Normal affect   Labs    High Sensitivity Troponin:   Recent Labs  Lab 10/01/20 2046 10/01/20 2318 10/02/20 0043  TROPONINIHS 9 1,099* 1,775*      Chemistry Recent Labs  Lab 10/01/20 2047 10/01/20 2318  NA 138  --   K 3.6  --   CL 100  --   CO2 24  --   GLUCOSE 135*  --   BUN 11  --   CREATININE 0.80 0.67  CALCIUM 9.0  --   PROT 7.1  --   ALBUMIN 4.1  --   AST 18  --   ALT 14  --   ALKPHOS 41  --   BILITOT 0.1*  --   GFRNONAA >60 >60  ANIONGAP 14  --      Hematology Recent Labs  Lab 10/01/20 2047 10/01/20 2318  WBC 6.3 7.1  RBC 4.76 4.07  HGB 13.7 11.8*  HCT 41.3 35.2*  MCV 86.8 86.5  MCH 28.8  29.0  MCHC 33.2 33.5  RDW 13.2 13.2  PLT 235 182    BNPNo results for input(s): BNP, PROBNP in the last 168 hours.   DDimer No results for input(s): DDIMER in the last 168 hours.   Radiology    CARDIAC CATHETERIZATION  Result Date: 10/01/2020  Mid LAD to Dist LAD lesion is 50% stenosed.  The left ventricular systolic function is normal.  LV end diastolic pressure is normal.  The left ventricular ejection fraction is 50-55% by visual estimate.  1. 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 Now with TIMI 3 flow. 2. Low normal LV function with apical hypokinesis. 3. Normal LVEDP Plan: will manage medically with ASA, statin, beta blocker. DVT prophylaxis. Observe closely in ICU tonight.   DG Chest Port 1 View  Result Date: 10/01/2020 CLINICAL DATA:  Chest pain EXAM: PORTABLE CHEST 1 VIEW COMPARISON:  07/01/2008 FINDINGS: The heart size and mediastinal contours are within normal  limits. Both lungs are clear. The visualized skeletal structures are unremarkable. IMPRESSION: Negative Electronically Signed   By: Charlett Nose M.D.   On: 10/01/2020 21:12    Cardiac Studies   CATH 10/02/2020: Diagnostic Dominance: Right    Left ventriculography demonstrated anteroapical hypokinesis.  Normal LVEDP.  EF greater than 50%.  Prediabetic hemoglobin A1c of 5.7  2D Doppler echocardiogram: No official report but there is moderate distal anterior wall and apical hypokinesis.  I do not see a thrombus.  Patient Profile     50 y.o. female present with inferior STEMI felt due to LAD SCAD.  Assessment & Plan    1. Spontaneous Coronary Artery Dissection: Mid LAD which by the time of angiography demonstrated segmental 50% narrowing with TIMI grade III flow suggesting spontaneous reperfusion.  Plan supportive therapy.  She has mild LV dysfunction with apical hypokinesis and therefore agree with beta-blocker therapy.  Aspirin and statin therapy are also reasonable.  Will consider carotid and lower extremity vascular ultrasound to rule out fibromuscular dysplasia. 2. Prediabetes: Based upon A1c of 5.7 3. Emotional stress related to marital discord: She is not opened up about this very much.  Disappointed that her husband has not shown up.   For questions or updates, please contact [facility] Please consult [email or web address] for contact info under        Signed, Lesleigh Noe, MD  10/02/2020, 8:26 AM

## 2020-10-02 NOTE — Progress Notes (Signed)
CARDIAC REHAB PHASE I   PRE:  Rate/Rhythm: 84 SR  BP:  Supine: 107/68  Sitting:   Standing:    SaO2: 98%RA  MODE:  Ambulation: 370 ft   POST:  Rate/Rhythm: 96 SR  PVCs  BP:  Supine:   Sitting: 112/81  Standing:    SaO2: 100%RA 1255-1350 Pt walked 370 ft on RA with steady gait and tolerated well. No CP. MI education completed except for ex ed. Discussed importance of statin, ASA, beta blocker and reviewed NTG use. Discussed heart healthy food choices and watching carbs with A1C at 5.7. Discussed MI restrictions and importance of letting heart heal. Discussed CRP 2 and referral letter will be sent to [facility] CRP 2. Will follow tomorrow to review ex ed and walk again. Pt voiced understanding.   Luetta Nutting, RN BSN  10/02/2020 1:44 PM

## 2020-10-03 ENCOUNTER — Encounter (HOSPITAL_COMMUNITY): Payer: No Typology Code available for payment source

## 2020-10-03 ENCOUNTER — Telehealth: Payer: Self-pay | Admitting: Cardiology

## 2020-10-03 DIAGNOSIS — R7303 Prediabetes: Secondary | ICD-10-CM

## 2020-10-03 DIAGNOSIS — E785 Hyperlipidemia, unspecified: Secondary | ICD-10-CM

## 2020-10-03 DIAGNOSIS — I2542 Coronary artery dissection: Secondary | ICD-10-CM

## 2020-10-03 MED ORDER — ASPIRIN 81 MG PO CHEW: 81.0000 mg | CHEWABLE_TABLET | Freq: Every day | ORAL | 1 refills | Status: DC

## 2020-10-03 MED ORDER — METOPROLOL SUCCINATE ER 25 MG PO TB24: 12.5000 mg | ORAL_TABLET | Freq: Every day | ORAL | 1 refills | Status: DC

## 2020-10-03 MED ORDER — ATORVASTATIN CALCIUM 40 MG PO TABS: 40.0000 mg | ORAL_TABLET | Freq: Every day | ORAL | 0 refills | Status: DC

## 2020-10-03 MED ORDER — NITROGLYCERIN 0.4 MG SL SUBL: 0.4000 mg | SUBLINGUAL_TABLET | SUBLINGUAL | 2 refills | Status: DC | PRN

## 2020-10-03 NOTE — Progress Notes (Addendum)
   The patient is ready for discharge.  She needs a 1 week follow-up with Dr. [place] or team member.  She should not return to work release 1 week and after the office visit.  Return to hospital if recurrent chest pain.  Discharge medications are: Metoprolol succinate 12.5 mg/day; atorvastatin 40 mg/day; and 1 mg aspirin daily.  Overall LV function on echo is 55 to 60%.  There is an apical wall motion abnormality.  No thrombus present.  Discharge EKG demonstrates inferolateral T wave inversion represents evolution of transmural injury.  Diagnoses to be added to her problem list and discharge summary are: Prediabetes; hyperlipidemia LDL less than 70

## 2020-10-03 NOTE — Progress Notes (Signed)
CARDIAC REHAB PHASE I   PRE:  Rate/Rhythm: 91 SR  BP:  Supine:   Sitting: 112/85  Standing:    SaO2: 99%RA  MODE:  Ambulation: 740 ft   POST:  Rate/Rhythm: 89 SR room  115 hall  BP:  Supine:   Sitting: 119/67  Standing:    SaO2: 97%RA 0839-0915 Pt walked 740 ft on RA with steady gait and no CP. Gave pt written walking instructions for ex. Encouraged pt to not overdo and to take it easy until she sees cardiologist. Reviewed MI restrictions again and importance of taking meds. Pt voiced understanding.  Tolerated walk well.    Luetta Nutting, RN BSN  10/03/2020 9:10 AM

## 2020-10-03 NOTE — Telephone Encounter (Signed)
Still admitted

## 2020-10-03 NOTE — Discharge Summary (Addendum)
The patient has been seen in conjunction with Laverda Page, NP. All aspects of care have been considered and discussed. The patient has been personally interviewed, examined, and all clinical data has been reviewed.   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  Educated about progression, return to work, possibility of recurrence and secondary risk prevention.  She is in denial about her illness.   Discharge Summary    Patient ID: Haley Morton MRN: [id number]; DOB: April 01, 1971  Admit date: 10/01/2020 Discharge date: 10/03/2020  PCP:  Patient, No Pcp Per   [facility]  Cardiologist:  Peter Swaziland, MD  Advanced Practice Provider:  No care team member to display Electrophysiologist:  None   Discharge Diagnoses    Principal Problem:   STEMI (ST elevation myocardial infarction) [facility]) Active Problems:   Spontaneous dissection of coronary artery   Pre-diabetes   Hyperlipidemia   Diagnostic Studies/Procedures    Cath: 10/01/20    Mid LAD to Dist LAD lesion is 50% stenosed.  The left ventricular systolic function is normal.  LV end diastolic pressure is normal.  The left ventricular ejection fraction is 50-55% by visual estimate.   1. 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 Now with TIMI 3 flow.  2. Low normal LV function with apical hypokinesis. 3. Normal LVEDP  Plan: will manage medically with ASA, statin, beta blocker. DVT prophylaxis. Observe closely in ICU tonight.   Diagnostic Dominance: Right   Echo: 10/02/20  IMPRESSIONS    1. Akinesis of the distal inferior, anteroseptal and apical walls;  hypokinesis of the distal anterior wall.. Left ventricular ejection  fraction, by estimation, is 55 to 60%. The left ventricle has normal  function. Left ventricular diastolic parameters  were normal.  2. Right ventricular systolic function is normal. The right  ventricular  size is normal. There is normal pulmonary artery systolic pressure.  3. The mitral valve is normal in structure. Trivial mitral valve  regurgitation.  4. The aortic valve is tricuspid. Aortic valve regurgitation is mild.  5. The inferior vena cava is dilated in size with <50% respiratory  variability, suggesting right atrial pressure of 15 mmHg.  _____________   History of Present Illness     Haley Morton is a 50 y.o. female with no significant PMH who presented to [facility] the night of admission with acute onset mid sternal chest pain with tingling radiating to her arms. Some nausea. No SOB or diaphoresis. Was at a friends house and drove to ED. Initial Ecg showed significant ST elevation in the inferolateral leads. Given ASA, IV heparin. Chest pain then resolved with resolution of ST elevation. Reported she has been under a lot of stress with marital issues. No history of HTN, HLD, DM, tobacco or drug use. No pertinent medical history. She was transferred to [facility] for further management with cardiac cath.  Hospital Course     1.  SCAD: Underwent cardiac catheterization with Dr. Swaziland noted above with single-vessel CAD with diffuse narrowing in the mid LAD consistent with scad.  Noted to have TIMI-3 flow at the time cath.  Low normal LV function with apical hypokinesis noted on LV gram. hsTn peaked at 1775. She was started on medical therapy with aspirin, statin and beta-blocker.  Carotid Dopplers were negative for fibromuscular dysplasia.  Follow-up echocardiogram showed an EF of 55 to 60% with akinesis in the distal inferior, anterior septal and apical walls.  She worked well with cardiac rehab without recurrent chest pain.  2.  Hyperlipidemia: Goal LDL of less than 70.  LDL 90 on admission --Started on atorvastatin 40 mg daily --Fasting lipid panel and LFTs in 8 weeks  3.  Pre-diabetes: Hemoglobin A1c 5.7  She was seen and evaluated by Dr. Katrinka Blazing prior to  discharge and determined to be stable.  She is instructed to not return to work until after her follow-up appointment in the office.  Did the patient have an acute coronary syndrome (MI, NSTEMI, STEMI, etc) this admission?:  Yes                               AHA/ACC Clinical Performance & Quality Measures: 4. Aspirin prescribed? - Yes 5. ADP Receptor Inhibitor (Plavix/Clopidogrel, Brilinta/Ticagrelor or Effient/Prasugrel) prescribed (includes medically managed patients)? - No - treated with ASA as monotherapy for SCAD 6. Beta Blocker prescribed? - Yes 7. High Intensity Statin (Lipitor 40-80mg  or Crestor 20-40mg ) prescribed? - Yes 8. EF assessed during THIS hospitalization? - Yes 9. For EF <40%, was ACEI/ARB prescribed? - Not Applicable (EF >/= 40%) 10. For EF <40%, Aldosterone Antagonist (Spironolactone or Eplerenone) prescribed? - Not Applicable (EF >/= 40%) 11. Cardiac Rehab Phase II ordered (including medically managed patients)? - Yes       _____________  Discharge Vitals Blood pressure 109/78, pulse 60, temperature 98.4 F (36.9 C), temperature source Oral, resp. rate 15, height 5\' 4"  (1.626 m), weight 50.2 kg, last menstrual period 09/24/2020, SpO2 97 %.  Filed Weights   10/01/20 2300  Weight: 50.2 kg    Labs & Radiologic Studies    CBC Recent Labs    10/01/20 2047 10/01/20 2318  WBC 6.3 7.1  NEUTROABS 3.4  --   HGB 13.7 11.8*  HCT 41.3 35.2*  MCV 86.8 86.5  PLT 235 182   Basic Metabolic Panel Recent Labs    10/03/20 2047 10/01/20 2318  NA 138  --   K 3.6  --   CL 100  --   CO2 24  --   GLUCOSE 135*  --   BUN 11  --   CREATININE 0.80 0.67  CALCIUM 9.0  --    Liver Function Tests Recent Labs    10/01/20 2047  AST 18  ALT 14  ALKPHOS 41  BILITOT 0.1*  PROT 7.1  ALBUMIN 4.1   No results for input(s): LIPASE, AMYLASE in the last 72 hours. High Sensitivity Troponin:   Recent Labs  Lab 10/01/20 2046 10/01/20 2318 10/02/20 0043  TROPONINIHS 9  1,099* 1,775*    BNP Invalid input(s): POCBNP D-Dimer No results for input(s): DDIMER in the last 72 hours. Hemoglobin A1C Recent Labs    10/01/20 2047  HGBA1C 5.7*   Fasting Lipid Panel Recent Labs    10/02/20 1157  CHOL 179  HDL 89  LDLCALC 80  TRIG 51  CHOLHDL 2.0   Thyroid Function Tests Recent Labs    10/01/20 2318  TSH 1.263   _____________  CARDIAC CATHETERIZATION  Result Date: 10/01/2020  Mid LAD to Dist LAD lesion is 50% stenosed.  The left ventricular systolic function is normal.  LV end diastolic pressure is normal.  The left ventricular ejection fraction is 50-55% by visual estimate.  1. 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 Now with TIMI 3 flow. 2. Low normal LV function with apical hypokinesis. 3.  Normal LVEDP Plan: will manage medically with ASA, statin, beta blocker. DVT prophylaxis. Observe closely in ICU tonight.   DG Chest Port 1 View  Result Date: 10/01/2020 CLINICAL DATA:  Chest pain EXAM: PORTABLE CHEST 1 VIEW COMPARISON:  07/01/2008 FINDINGS: The heart size and mediastinal contours are within normal limits. Both lungs are clear. The visualized skeletal structures are unremarkable. IMPRESSION: Negative Electronically Signed   By: Charlett Nose M.D.   On: 10/01/2020 21:12   ECHOCARDIOGRAM COMPLETE  Result Date: 10/02/2020    ECHOCARDIOGRAM REPORT   Patient Name:   Haley Morton Date of Exam: 10/02/2020 Medical Rec #:  [id number]   Height:       64.0 in Accession #:    [id number]  Weight:       110.7 lb Date of Birth:  09/04/1970   BSA:          1.521 m Patient Age:    50 years    BP:           116/76 mmHg Patient Gender: F           HR:           61 bpm. Exam Location:  Inpatient Procedure: 2D Echo, Cardiac Doppler, Color Doppler, Intracardiac Opacification            Agent and 3D Echo Indications:    STEMI  History:        Patient has no prior history of Echocardiogram examinations.                  Acute MI, CAD and Previous Myocardial Infarction.  Sonographer:    Neomia Dear RDCS Referring Phys: [phone] PETER M Sw[place]iland  Sonographer Comments: Suboptimal apical window. IMPRESSIONS  1. Akinesis of the distal inferior, anteroseptal and apical walls; hypokinesis of the distal anterior wall.. Left ventricular ejection fraction, by estimation, is 55 to 60%. The left ventricle has normal function. Left ventricular diastolic parameters were normal.  2. Right ventricular systolic function is normal. The right ventricular size is normal. There is normal pulmonary artery systolic pressure.  3. The mitral valve is normal in structure. Trivial mitral valve regurgitation.  4. The aortic valve is tricuspid. Aortic valve regurgitation is mild.  5. The inferior vena cava is dilated in size with <50% respiratory variability, suggesting right atrial pressure of 15 mmHg. FINDINGS  Left Ventricle: Akinesis of the distal inferior, anteroseptal and apical walls; hypokinesis of the distal anterior wall. Left ventricular ejection fraction, by estimation, is 55 to 60%. The left ventricle has normal function. The left ventricle demonstrates regional wall motion abnormalities. Definity contrast agent was given IV to delineate the left ventricular endocardial borders. 3D left ventricular ejection fraction analysis performed but not reported based on interpreter judgement due to suboptimal quality. The left ventricular internal cavity size was normal in size. There is no left ventricular hypertrophy. Left ventricular diastolic parameters were normal. Right Ventricle: The right ventricular size is normal. Right vetricular wall thickness was not assessed. Right ventricular systolic function is normal. There is normal pulmonary artery systolic pressure. The tricuspid regurgitant velocity is 2.17 m/s, and with an assumed right atrial pressure of 3 mmHg, the estimated right ventricular systolic pressure is 21.8 mmHg. Left Atrium: Left atrial  size was normal in size. Right Atrium: Right atrial size was normal in size. Pericardium: There is no evidence of pericardial effusion. Mitral Valve: The mitral valve is normal in structure. Trivial mitral valve regurgitation. Tricuspid Valve: The tricuspid valve  is normal in structure. Tricuspid valve regurgitation is mild. Aortic Valve: The aortic valve is tricuspid. Aortic valve regurgitation is mild. Aortic regurgitation PHT measures 751 msec. Aortic valve mean gradient measures 4.0 mmHg. Aortic valve peak gradient measures 7.7 mmHg. Aortic valve area, by VTI measures 1.41 cm. Pulmonic Valve: The pulmonic valve was not well visualized. Pulmonic valve regurgitation is trivial. Aorta: The aortic root and ascending aorta are structurally normal, with no evidence of dilitation. Venous: The inferior vena cava is dilated in size with less than 50% respiratory variability, suggesting right atrial pressure of 15 mmHg. IAS/Shunts: No atrial level shunt detected by color flow Doppler.  LEFT VENTRICLE PLAX 2D LVIDd:         4.50 cm     Diastology LVIDs:         3.10 cm     LV e' medial:    11.00 cm/s LV PW:         0.70 cm     LV E/e' medial:  6.7 LV IVS:        0.50 cm     LV e' lateral:   12.10 cm/s LVOT diam:     1.90 cm     LV E/e' lateral: 6.1 LV SV:         47 LV SV Index:   31 LVOT Area:     2.84 cm  LV Volumes (MOD) LV vol d, MOD A4C: 78.3 ml LV vol s, MOD A4C: 29.4 ml LV SV MOD A4C:     78.3 ml RIGHT VENTRICLE RV S prime:     10.90 cm/s TAPSE (M-mode): 2.4 cm LEFT ATRIUM             Index       RIGHT ATRIUM           Index LA diam:        2.10 cm 1.38 cm/m  RA Area:     10.50 cm LA Vol (A2C):   23.1 ml 15.19 ml/m RA Volume:   23.70 ml  15.58 ml/m LA Vol (A4C):   31.7 ml 20.84 ml/m LA Biplane Vol: 28.8 ml 18.93 ml/m  AORTIC VALVE                   PULMONIC VALVE AV Area (Vmax):    1.49 cm    PV Vmax:       1.00 m/s AV Area (Vmean):   1.38 cm    PV Vmean:      72.000 cm/s AV Area (VTI):     1.41 cm    PV  VTI:        0.250 m AV Vmax:           139.00 cm/s PV Peak grad:  4.0 mmHg AV Vmean:          95.800 cm/s PV Mean grad:  2.0 mmHg AV VTI:            0.331 m AV Peak Grad:      7.7 mmHg AV Mean Grad:      4.0 mmHg LVOT Vmax:         73.10 cm/s LVOT Vmean:        46.600 cm/s LVOT VTI:          0.165 m LVOT/AV VTI ratio: 0.50 AI PHT:            751 msec  AORTA Ao Root diam: 2.80 cm Ao Asc diam:  2.50 cm  MITRAL VALVE               TRICUSPID VALVE MV Area (PHT): 3.65 cm    TR Peak grad:   18.8 mmHg MV Decel Time: 208 msec    TR Vmax:        217.00 cm/s MV E velocity: 74.10 cm/s MV A velocity: 53.10 cm/s  SHUNTS MV E/A ratio:  1.40        Systemic VTI:  0.16 m                            Systemic Diam: 1.90 cm Dietrich PatesPaula Ross MD Electronically signed by Dietrich PatesPaula Ross MD Signature Date/Time: 10/02/2020/2:47:27 PM    Final    VAS US CAROTID  Result Date: 10/02/2020 Carotid Arterial Duplex Study Indications:       SCAD - r/o Fibromuscular dysplasia. Comparison Study:  no prior Performing Technologist: Blanch MediaMegan Riddle RVS  Examination Guidelines: A complete evaluation includes B-mode imaging, spectral Doppler, color Doppler, and power Doppler as needed of all accessible portions of each vessel. Bilateral testing is considered an integral part of a complete examination. Limited examinations for reoccurring indications may be performed as noted.  Right Carotid Findings: +----------+--------+--------+--------+------------------+--------+           PSV cm/sEDV cm/sStenosisPlaque DescriptionComments +----------+--------+--------+--------+------------------+--------+ CCA Prox  104     29              heterogenous               +----------+--------+--------+--------+------------------+--------+ CCA Distal94      42              heterogenous               +----------+--------+--------+--------+------------------+--------+ ICA Prox  79      35      1-39%   heterogenous                +----------+--------+--------+--------+------------------+--------+ ICA Distal157     72                                         +----------+--------+--------+--------+------------------+--------+ ECA       81      19                                         +----------+--------+--------+--------+------------------+--------+ +----------+--------+-------+--------+-------------------+           PSV cm/sEDV cmsDescribeArm Pressure (mmHG) +----------+--------+-------+--------+-------------------+ [id number]                                         +----------+--------+-------+--------+-------------------+ +---------+--------+--+--------+--+---------+ VertebralPSV cm/s47EDV cm/s15Antegrade +---------+--------+--+--------+--+---------+  Left Carotid Findings: +----------+--------+--------+--------+------------------+--------+           PSV cm/sEDV cm/sStenosisPlaque DescriptionComments +----------+--------+--------+--------+------------------+--------+ CCA Prox  98      19              heterogenous               +----------+--------+--------+--------+------------------+--------+ CCA Distal91      27              heterogenous               +----------+--------+--------+--------+------------------+--------+  ICA Prox  84      34      1-39%   heterogenous               +----------+--------+--------+--------+------------------+--------+ ICA Distal107     45                                         +----------+--------+--------+--------+------------------+--------+ ECA       78      20                                         +----------+--------+--------+--------+------------------+--------+ +----------+--------+--------+--------+-------------------+           PSV cm/sEDV cm/sDescribeArm Pressure (mmHG) +----------+--------+--------+--------+-------------------+ Subclavian100                                          +----------+--------+--------+--------+-------------------+ +---------+--------+--+--------+--+---------+ VertebralPSV cm/s57EDV cm/s24Antegrade +---------+--------+--+--------+--+---------+   Summary: Right Carotid: Velocities in the right ICA are consistent with a 1-39% stenosis. Left Carotid: Velocities in the left ICA are consistent with a 1-39% stenosis. Vertebrals: Bilateral vertebral arteries demonstrate antegrade flow. *See table(s) above for measurements and observations.  Electronically signed by Lemar Livings MD on 10/02/2020 at 10:18:44 PM.    Final    Disposition   Pt is being discharged home today in good condition.  Follow-up Plans & Appointments     Follow-up Information    Swaziland, Peter M, MD Follow up on 10/12/2020.   Specialty: Cardiology Why: at 11:40am for your follow up appt Contact information: [address] [place] [address] [phone]              Discharge Instructions    Amb Referral to Cardiac Rehabilitation   Complete by: As directed    Referring to [place] [facility]   Diagnosis: STEMI   After initial evaluation and assessments completed: Virtual Based Care may be provided alone or in conjunction with Phase 2 Cardiac Rehab based on patient barriers.: Yes   Call MD for:  difficulty breathing, headache or visual disturbances   Complete by: As directed    Call MD for:  persistant dizziness or light-headedness   Complete by: As directed    Call MD for:  redness, tenderness, or signs of infection (pain, swelling, redness, odor or green/yellow discharge around incision site)   Complete by: As directed    Diet - low sodium heart healthy   Complete by: As directed    Discharge instructions   Complete by: As directed    Radial Site Care Refer to this sheet in the next few weeks. These instructions provide you with information on caring for yourself after your procedure. Your caregiver may also give you more specific instructions. Your  treatment has been planned according to current medical practices, but problems sometimes occur. Call your caregiver if you have any problems or questions after your procedure. HOME CARE INSTRUCTIONS You may shower the day after the procedure.Remove the bandage (dressing) and gently wash the site with plain soap and water.Gently pat the site dry.  Do not apply powder or lotion to the site.  Do not submerge the affected site in water for 3 to 5 days.  Inspect  the site at least twice daily.  Do not flex or bend the affected arm for 24 hours.  No lifting over 5 pounds (2.3 kg) for 5 days after your procedure.  Do not drive home if you are discharged the same day of the procedure. Have someone else drive you.  You may drive 24 hours after the procedure unless otherwise instructed by your caregiver.  What to expect: Any bruising will usually fade within 1 to 2 weeks.  Blood that collects in the tissue (hematoma) may be painful to the touch. It should usually decrease in size and tenderness within 1 to 2 weeks.  SEEK IMMEDIATE MEDICAL CARE IF: You have unusual pain at the radial site.  You have redness, warmth, swelling, or pain at the radial site.  You have drainage (other than a small amount of blood on the dressing).  You have chills.  You have a fever or persistent symptoms for more than 72 hours.  You have a fever and your symptoms suddenly get worse.  Your arm becomes pale, cool, tingly, or numb.  You have heavy bleeding from the site. Hold pressure on the site.   Increase activity slowly   Complete by: As directed       Discharge Medications   Allergies as of 10/03/2020   No Known Allergies     Medication List    STOP taking these medications   ibuprofen 200 MG tablet Commonly known as: ADVIL     TAKE these medications   aspirin 81 MG chewable tablet Chew 1 tablet (81 mg total) by mouth daily. Start taking on: October 04, 2020   atorvastatin 40 MG tablet Commonly known  as: LIPITOR Take 1 tablet (40 mg total) by mouth daily. Start taking on: October 04, 2020   LUBRICATING EYE DROPS OP Place 1 drop into both eyes 3 (three) times daily as needed (dry eyes).   metoprolol succinate 25 MG 24 hr tablet Commonly known as: TOPROL-XL Take 0.5 tablets (12.5 mg total) by mouth daily. Start taking on: October 04, 2020   nitroGLYCERIN 0.4 MG SL tablet Commonly known as: NITROSTAT Place 1 tablet (0.4 mg total) under the tongue every 5 (five) minutes as needed for chest pain (x3prn).        Outstanding Labs/Studies   FLP/LFTs in 8 weeks  Duration of Discharge Encounter   Greater than 30 minutes including physician time.  Signed, Laverda Page, NP 10/03/2020, 11:06 AM

## 2020-10-03 NOTE — Telephone Encounter (Signed)
Patient has an upcoming TOC follow-up appointment on 10/12/20 at 11:40 AM with Dr. Swaziland, per Laverda Page, NP.

## 2020-10-06 ENCOUNTER — Telehealth (HOSPITAL_COMMUNITY): Payer: Self-pay

## 2020-10-06 NOTE — Telephone Encounter (Signed)
Cardiac rehab referral for Ph.II faxed to [facility]. 

## 2020-10-06 NOTE — Telephone Encounter (Signed)
Left message on home phone  Please contact office to discuss recent discharge from the hospital  ( may speak to any triage nurse)

## 2020-10-09 NOTE — Progress Notes (Signed)
Cardiology Office Note   Date:  10/09/2020   ID:  Haley Morton, DOB 11/08/1970, MRN [id number]  PCP:  Patient, No Pcp Per  Cardiologist:   Haley Salada [place], MD   No chief complaint on file.     History of Present Illness: Haley Morton is a 50 y.o. female who presents for TOC follow up after recent hospitalization for NSTEMI. She presented  to [facility] the night of admission with acute onset mid sternal chest pain with tingling radiating to her arms. Some nausea. No SOB or diaphoresis. Was at a friends house and drove to ED. Initial Ecg showed significant ST elevation in the inferolateral leads. Given ASA, IV heparin. Chest pain then resolved with resolution of ST elevation. Reported she has been under a lot of stress with marital issues. No history of HTN, HLD, DM, tobacco or drug use. 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  Noted to have TIMI-3 flow at the time cath.  Low normal LV function with apical hypokinesis noted on LV gram. hsTn peaked at 1775. She was started on medical therapy with aspirin, statin and beta-blocker.  Carotid Dopplers were negative for fibromuscular dysplasia.  Follow-up echocardiogram showed an EF of 55 to 60% with akinesis in the distal inferior, anterior septal and apical walls. She had no recurrent chest pain and ambulated with cardiac Rehab.   Since DC she has done very well. No recurrent chest pain. She is anxious to resume work and exercise. Tolerating medication well. Some bruising at cath site.   No past medical history on file.  Past Surgical History:  Procedure Laterality Date  . LEFT HEART CATH AND CORONARY ANGIOGRAPHY N/A 10/01/2020   Procedure: LEFT HEART CATH AND CORONARY ANGIOGRAPHY;  Surgeon: Morton, Decklyn Hornik M, MD;  Location: [facility] INVASIVE CV LAB;  Service: Cardiovascular;  Laterality: N/A;     Current Outpatient Medications   Medication Sig Dispense Refill  . aspirin 81 MG chewable tablet Chew 1 tablet (81 mg total) by mouth daily. 90 tablet 1  . atorvastatin (LIPITOR) 40 MG tablet Take 1 tablet (40 mg total) by mouth daily. 90 tablet 0  . Carboxymethylcellul-Glycerin (LUBRICATING EYE DROPS OP) Place 1 drop into both eyes 3 (three) times daily as needed (dry eyes).    . metoprolol succinate (TOPROL-XL) 25 MG 24 hr tablet Take 0.5 tablets (12.5 mg total) by mouth daily. 45 tablet 1  . nitroGLYCERIN (NITROSTAT) 0.4 MG SL tablet Place 1 tablet (0.4 mg total) under the tongue every 5 (five) minutes as needed for chest pain (x3prn). 25 tablet 2   No current facility-administered medications for this visit.    Allergies:   Patient has no known allergies.    Social History:  The patient     Family History:  The patient's family history is negative for CAD.    ROS:  Please see the history of present illness.   Otherwise, review of systems are positive for none.   All other systems are reviewed and negative.    PHYSICAL EXAM: VS:  LMP 09/24/2020 (Approximate)  , BMI There is no height or weight on file to calculate BMI. GEN: Well nourished, well developed, in no acute distress  HEENT: normal  Neck: no JVD, carotid bruits, or masses Cardiac: RRR; no murmurs, rubs, or gallops,no edema  Respiratory:  clear to auscultation bilaterally, normal work of breathing GI: soft, nontender, nondistended, + BS  MS: no deformity or atrophy  Skin: warm and dry, no rash Neuro:  Strength and sensation are intact Psych: euthymic mood, full affect   EKG:  EKG is not ordered today. The ekg ordered today demonstrates N/A   Recent Labs: 10/01/2020: ALT 14; BUN 11; Creatinine, Ser 0.67; Hemoglobin 11.8; Platelets 182; Potassium 3.6; Sodium 138; TSH 1.263    Lipid Panel    Component Value Date/Time   CHOL 179 10/02/2020 1157   TRIG 51 10/02/2020 1157   HDL 89 10/02/2020 1157   CHOLHDL 2.0 10/02/2020 1157   VLDL 10  10/02/2020 1157   LDLCALC 80 10/02/2020 1157      Wt Readings from Last 3 Encounters:  10/01/20 110 lb 10.7 oz (50.2 kg)      Other studies Reviewed: Additional studies/ records that were reviewed today include:   Cath: 10/01/20    Mid LAD to Dist LAD lesion is 50% stenosed.  The left ventricular systolic function is normal.  LV end diastolic pressure is normal.  The left ventricular ejection fraction is 50-55% by visual estimate.  1. 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 Now with TIMI 3 flow.  2. Low normal LV function with apical hypokinesis. 3. Normal LVEDP  Plan: will manage medically with ASA, statin, beta blocker. DVT prophylaxis. Observe closely in ICU tonight.  Diagnostic Dominance: Right   Echo: 10/02/20  IMPRESSIONS    1. Akinesis of the distal inferior, anteroseptal and apical walls;  hypokinesis of the distal anterior wall.. Left ventricular ejection  fraction, by estimation, is 55 to 60%. The left ventricle has normal  function. Left ventricular diastolic parameters  were normal.  2. Right ventricular systolic function is normal. The right ventricular  size is normal. There is normal pulmonary artery systolic pressure.  3. The mitral valve is normal in structure. Trivial mitral valve  regurgitation.  4. The aortic valve is tricuspid. Aortic valve regurgitation is mild.  5. The inferior vena cava is dilated in size with <50% respiratory  variability, suggesting right atrial pressure of 15 mmHg.  _____________     ASSESSMENT AND PLAN:  1.  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 Angiogram c/w SCAD. Will continue ASA, statin, beta blocker. Discussed gradual increase in activity primarily with walking. Would avoid high intensity training for now. Discussed SCAD and its treatment.  Will follow up in 2 months with fasting lab.    2.  Hyperlipidemia: Goal LDL of less than 70.  LDL 90 on admission --Started on atorvastatin 40 mg daily --Fasting lipid panel and LFTs in 8 weeks    Current medicines are reviewed at length with the patient today.  The patient does not have concerns regarding medicines.  The following changes have been made:  no change  Labs/ tests ordered today include:  No orders of the defined types were placed in this encounter.    Disposition:   FU with me  in 2 months  Signed, Nyeema Want [place], MD  10/09/2020 7:52 AM    [facility] [address], [place], [place], [address] Phone [phone], Fax [phone]

## 2020-10-12 ENCOUNTER — Ambulatory Visit (INDEPENDENT_AMBULATORY_CARE_PROVIDER_SITE_OTHER): Payer: No Typology Code available for payment source | Admitting: Cardiology

## 2020-10-12 ENCOUNTER — Encounter: Payer: Self-pay | Admitting: Cardiology

## 2020-10-12 ENCOUNTER — Other Ambulatory Visit: Payer: Self-pay

## 2020-10-12 VITALS — BP 120/66 | HR 64 | Ht 64.0 in | Wt 113.4 lb

## 2020-10-12 DIAGNOSIS — I214 Non-ST elevation (NSTEMI) myocardial infarction: Secondary | ICD-10-CM

## 2020-10-12 DIAGNOSIS — I2542 Coronary artery dissection: Secondary | ICD-10-CM

## 2020-10-12 DIAGNOSIS — E7800 Pure hypercholesterolemia, unspecified: Secondary | ICD-10-CM

## 2020-10-13 NOTE — Telephone Encounter (Signed)
Patient was seen 3/31 by dr [place]

## 2020-11-23 LAB — LIPID PANEL
Chol/HDL Ratio: 1.4 ratio (ref 0.0–4.4)
Cholesterol, Total: 142 mg/dL (ref 100–199)
HDL: 98 mg/dL (ref >39)
LDL Chol Calc (NIH): 33 mg/dL (ref 0–99)
Triglycerides: 49 mg/dL (ref 0–149)
VLDL Cholesterol Cal: 11 mg/dL (ref 5–40)

## 2020-11-23 LAB — BASIC METABOLIC PANEL
BUN/Creatinine Ratio: 14 (ref 9–23)
BUN: 10 mg/dL (ref 6–24)
CO2: 24 mmol/L (ref 20–29)
Calcium: 9.8 mg/dL (ref 8.7–10.2)
Chloride: 100 mmol/L (ref 96–106)
Creatinine, Ser: 0.7 mg/dL (ref 0.57–1.00)
Glucose: 101 mg/dL — ABNORMAL HIGH (ref 65–99)
Potassium: 4.1 mmol/L (ref 3.5–5.2)
Sodium: 138 mmol/L (ref 134–144)
eGFR: 105 mL/min/{1.73_m2} (ref >59)

## 2020-11-23 LAB — HEPATIC FUNCTION PANEL
ALT: 29 IU/L (ref 0–32)
AST: 28 IU/L (ref 0–40)
Albumin: 4.9 g/dL — ABNORMAL HIGH (ref 3.8–4.8)
Alkaline Phosphatase: 52 IU/L (ref 44–121)
Bilirubin Total: 0.4 mg/dL (ref 0.0–1.2)
Bilirubin, Direct: 0.14 mg/dL (ref 0.00–0.40)
Total Protein: 6.8 g/dL (ref 6.0–8.5)

## 2020-11-24 NOTE — Progress Notes (Signed)
Cardiology Office Note   Date:  11/27/2020   ID:  Haley Morton, DOB Dec 06, 1970, MRN [id number]  PCP:  Patient, No Pcp Per (Inactive)  Cardiologist:   Ziere Docken [place], MD   Chief Complaint  Patient presents with  . Coronary Artery Disease      History of Present Illness: Haley Morton is a 50 y.o. female who presents for TOC follow up after recent hospitalization for NSTEMI. She presented  to [facility] the night of admission with acute onset mid sternal chest pain with tingling radiating to her arms. Some nausea. No SOB or diaphoresis. Was at a friends house and drove to ED. Initial Ecg showed significant ST elevation in the inferolateral leads. Given ASA, IV heparin. Chest pain then resolved with resolution of ST elevation. Reported she has been under a lot of stress with marital issues. No history of HTN, HLD, DM, tobacco or drug use. 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  Noted to have TIMI-3 flow at the time cath.  Low normal LV function with apical hypokinesis noted on LV gram. hsTn peaked at 1775. She was started on medical therapy with aspirin, statin and beta-blocker.  Carotid Dopplers were negative for fibromuscular dysplasia.  Follow-up echocardiogram showed an EF of 55 to 60% with akinesis in the distal inferior, anterior septal and apical walls. She had no recurrent chest pain and ambulated with cardiac Rehab.   On follow up today she is doing very well. Only notes 2-3 episodes of mild chest pressure. nothing like her initial event.  She is anxious to resume  exercise. Tolerating medication well.   History reviewed. No pertinent past medical history.  Past Surgical History:  Procedure Laterality Date  . LEFT HEART CATH AND CORONARY ANGIOGRAPHY N/A 10/01/2020   Procedure: LEFT HEART CATH AND CORONARY ANGIOGRAPHY;  Surgeon: [place], Dmani Mizer M, MD;  Location: [facility]  INVASIVE CV LAB;  Service: Cardiovascular;  Laterality: N/A;     Current Outpatient Medications  Medication Sig Dispense Refill  . aspirin 81 MG chewable tablet Chew 1 tablet (81 mg total) by mouth daily. 90 tablet 1  . atorvastatin (LIPITOR) 40 MG tablet Take 1 tablet (40 mg total) by mouth daily. 90 tablet 0  . Carboxymethylcellul-Glycerin (LUBRICATING EYE DROPS OP) Place 1 drop into both eyes 3 (three) times daily as needed (dry eyes).    . metoprolol succinate (TOPROL-XL) 25 MG 24 hr tablet Take 0.5 tablets (12.5 mg total) by mouth daily. 45 tablet 1  . nitroGLYCERIN (NITROSTAT) 0.4 MG SL tablet Place 1 tablet (0.4 mg total) under the tongue every 5 (five) minutes as needed for chest pain (x3prn). 25 tablet 2   No current facility-administered medications for this visit.    Allergies:   Patient has no known allergies.    Social History:  The patient  reports that she has never smoked. She has never used smokeless tobacco.   Family History:  The patient's family history is negative for CAD.    ROS:  Please see the history of present illness.   Otherwise, review of systems are positive for none.   All other systems are reviewed and negative.    PHYSICAL EXAM: VS:  BP 128/72   Pulse 89   Ht 5\' 4"  (1.626 m)   Wt 107 lb (48.5 kg)   SpO2 96%   BMI 18.37 kg/m  , BMI Body mass index is  18.37 kg/m. GEN: Well nourished, well developed, in no acute distress  HEENT: normal  Neck: no JVD, carotid bruits, or masses Cardiac: RRR; no murmurs, rubs, or gallops,no edema  Respiratory:  clear to auscultation bilaterally, normal work of breathing GI: soft, nontender, nondistended, + BS MS: no deformity or atrophy  Skin: warm and dry, no rash Neuro:  Strength and sensation are intact Psych: euthymic mood, full affect   EKG:  EKG is not ordered today. The ekg ordered today demonstrates N/A   Recent Labs: 10/01/2020: Hemoglobin 11.8; Platelets 182; TSH 1.263 11/23/2020: ALT 29; BUN 10;  Creatinine, Ser 0.70; Potassium 4.1; Sodium 138    Lipid Panel    Component Value Date/Time   CHOL 142 11/23/2020 0810   TRIG 49 11/23/2020 0810   HDL 98 11/23/2020 0810   CHOLHDL 1.4 11/23/2020 0810   CHOLHDL 2.0 10/02/2020 1157   VLDL 10 10/02/2020 1157   LDLCALC 33 11/23/2020 0810      Wt Readings from Last 3 Encounters:  11/27/20 107 lb (48.5 kg)  10/12/20 113 lb 6.4 oz (51.4 kg)  10/01/20 110 lb 10.7 oz (50.2 kg)      Other studies Reviewed: Additional studies/ records that were reviewed today include:   Cath: 10/01/20    Mid LAD to Dist LAD lesion is 50% stenosed.  The left ventricular systolic function is normal.  LV end diastolic pressure is normal.  The left ventricular ejection fraction is 50-55% by visual estimate.  1. 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 Now with TIMI 3 flow.  2. Low normal LV function with apical hypokinesis. 3. Normal LVEDP  Plan: will manage medically with ASA, statin, beta blocker. DVT prophylaxis. Observe closely in ICU tonight.  Diagnostic Dominance: Right   Echo: 10/02/20  IMPRESSIONS    1. Akinesis of the distal inferior, anteroseptal and apical walls;  hypokinesis of the distal anterior wall.. Left ventricular ejection  fraction, by estimation, is 55 to 60%. The left ventricle has normal  function. Left ventricular diastolic parameters  were normal.  2. Right ventricular systolic function is normal. The right ventricular  size is normal. There is normal pulmonary artery systolic pressure.  3. The mitral valve is normal in structure. Trivial mitral valve  regurgitation.  4. The aortic valve is tricuspid. Aortic valve regurgitation is mild.  5. The inferior vena cava is dilated in size with <50% respiratory  variability, suggesting right atrial pressure of 15 mmHg.  _____________     ASSESSMENT AND PLAN:  1.  NSTEMI secondary to SCAD of the  mid LAD: fortunately the vessel was open by the time we arrived in the cath lab with resolution of ST changes. Angiogram c/w SCAD. Will continue ASA, statin, beta blocker. She is now free to resume normal activity including her exercise program and HIT. Follow up in 6 months.  2.  Hyperlipidemia: Goal LDL of less than 70.  LDL 90 on admission- now 33.  --on atorvastatin 40 mg daily    Disposition:   FU with me  in 6 months   Signed, Eleanor Gatliff [place], MD  11/27/2020 11:44 AM    [facility] [address], [place], [place], [address] Phone [phone], Fax [phone]

## 2020-11-27 ENCOUNTER — Encounter: Payer: Self-pay | Admitting: Cardiology

## 2020-11-27 ENCOUNTER — Other Ambulatory Visit: Payer: Self-pay

## 2020-11-27 ENCOUNTER — Ambulatory Visit (INDEPENDENT_AMBULATORY_CARE_PROVIDER_SITE_OTHER): Payer: No Typology Code available for payment source | Admitting: Cardiology

## 2020-11-27 VITALS — BP 128/72 | HR 89 | Ht 64.0 in | Wt 107.0 lb

## 2020-11-27 DIAGNOSIS — E7800 Pure hypercholesterolemia, unspecified: Secondary | ICD-10-CM | POA: Diagnosis not present

## 2020-11-27 DIAGNOSIS — I2542 Coronary artery dissection: Secondary | ICD-10-CM | POA: Diagnosis not present

## 2020-12-28 ENCOUNTER — Other Ambulatory Visit: Payer: Self-pay | Admitting: Cardiology

## 2021-03-25 ENCOUNTER — Other Ambulatory Visit: Payer: Self-pay | Admitting: Cardiology

## 2021-09-16 ENCOUNTER — Other Ambulatory Visit: Payer: Self-pay | Admitting: Cardiology

## 2022-03-13 ENCOUNTER — Other Ambulatory Visit: Payer: Self-pay | Admitting: Cardiology

## 2022-06-11 ENCOUNTER — Other Ambulatory Visit: Payer: Self-pay | Admitting: Cardiology

## 2022-07-26 NOTE — Progress Notes (Unsigned)
Cardiology Office Note   Date:  07/29/2022   ID:  Haley Morton, DOB 1971-06-30, MRN [id number]  PCP:  Osie Cheeks, PA-C  Cardiologist:   Kalisha Keadle [place], MD   Chief Complaint  Patient presents with   Dizziness      History of Present Illness: Haley Morton is a 52 y.o. female who presents for follow up SCAD. She presented  to [facility] in March 2022 with acute onset mid sternal chest pain with tingling radiating to her arms. Some nausea. No SOB or diaphoresis. Was at a friends house and drove to ED. Initial Ecg showed significant ST elevation in the inferolateral leads. Given ASA, IV heparin. Chest pain then resolved with resolution of ST elevation. Reported she has been under a lot of stress with marital issues. No history of HTN, HLD, DM, tobacco or drug use. No pertinent medical history. 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  Noted to have TIMI-3 flow at the time cath.  Low normal LV function with apical hypokinesis noted on LV gram. hsTn peaked at 1775. She was started on medical therapy with aspirin, statin and beta-blocker.  Carotid Dopplers were negative for fibromuscular dysplasia.  Follow-up echocardiogram showed an EF of 55 to 60% with akinesis in the distal inferior, anterior septal and apical walls. She had no recurrent chest pain and ambulated with cardiac Rehab.   On follow up today she is doing well. She is under a lot of stress. She is currently separated after 30 years of marriage with divorce to be finalized soon. She is working several jobs to [employer], [employer]. She is working out on her elliptical at times. She did note a couple of episodes at work of lightheadedness. No chest pain. Rare chest tightness. She does note her hands go numb R>L.   Past Medical History:  Diagnosis Date   Spontaneous dissection of coronary artery     Past Surgical History:  Procedure Laterality  Date   LEFT HEART CATH AND CORONARY ANGIOGRAPHY N/A 10/01/2020   Procedure: LEFT HEART CATH AND CORONARY ANGIOGRAPHY;  Surgeon: [place], Crystle Carelli M, MD;  Location: [place] CV LAB;  Service: Cardiovascular;  Laterality: N/A;     Current Outpatient Medications  Medication Sig Dispense Refill   atorvastatin (LIPITOR) 20 MG tablet Take 1 tablet (20 mg total) by mouth daily. 90 tablet 3   Carboxymethylcellul-Glycerin (LUBRICATING EYE DROPS OP) Place 1 drop into both eyes 3 (three) times daily as needed (dry eyes).     aspirin EC (ASPIRIN LOW DOSE) 81 MG tablet Take 1 tablet (81 mg total) by mouth daily. Swallow whole. 90 tablet 3   metoprolol succinate (TOPROL-XL) 25 MG 24 hr tablet Take 0.5 tablets (12.5 mg total) by mouth daily. 90 tablet 3   nitroGLYCERIN (NITROSTAT) 0.4 MG SL tablet Place 1 tablet (0.4 mg total) under the tongue every 5 (five) minutes as needed for chest pain (x3prn). 25 tablet 2   No current facility-administered medications for this visit.    Allergies:   Patient has no known allergies.    Social History:  The patient  reports that she has never smoked. She has never used smokeless tobacco.   Family History:  The patient's family history is negative for CAD.    ROS:  Please see the history of present illness.   Otherwise, review of systems are positive for none.   All other systems are  reviewed and negative.    PHYSICAL EXAM: VS:  BP 114/82   Pulse 61   Ht 5\' 4"  (1.626 m)   Wt 111 lb 12.8 oz (50.7 kg)   SpO2 100%   BMI 19.19 kg/m  , BMI Body mass index is 19.19 kg/m. GEN: Well nourished, thin, in no acute distress  HEENT: normal  Neck: no JVD, carotid bruits, or masses Cardiac: RRR; no murmurs, rubs, or gallops,no edema  Respiratory:  clear to auscultation bilaterally, normal work of breathing GI: soft, nontender, nondistended, + BS MS: no deformity or atrophy  Skin: warm and dry, no rash Neuro:  Strength and sensation are intact Psych: euthymic mood,  full affect   EKG:  EKG is ordered today. The ekg ordered today demonstrates NSR rate 61 with first degree AV block. Otherwise normal. I have personally reviewed and interpreted this study.    Recent Labs: No results found for requested labs within last 365 days.    Lipid Panel    Component Value Date/Time   CHOL 142 11/23/2020 0810   TRIG 49 11/23/2020 0810   HDL 98 11/23/2020 0810   CHOLHDL 1.4 11/23/2020 0810   CHOLHDL 2.0 10/02/2020 1157   VLDL 10 10/02/2020 1157   LDLCALC 33 11/23/2020 0810      Wt Readings from Last 3 Encounters:  07/29/22 111 lb 12.8 oz (50.7 kg)  11/27/20 107 lb (48.5 kg)  10/12/20 113 lb 6.4 oz (51.4 kg)      Other studies Reviewed: Additional studies/ records that were reviewed today include:   Cath: 10/01/20    Mid LAD to Dist LAD lesion is 50% stenosed. The left ventricular systolic function is normal. LV end diastolic pressure is normal. The left ventricular ejection fraction is 50-55% by visual estimate.   1. 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 Now with TIMI 3 flow.  2. Low normal LV function with apical hypokinesis. 3. Normal LVEDP   Plan: will manage medically with ASA, statin, beta blocker. DVT prophylaxis. Observe closely in ICU tonight.  Diagnostic Dominance: Right    Echo: 10/02/20   IMPRESSIONS     1. Akinesis of the distal inferior, anteroseptal and apical walls;  hypokinesis of the distal anterior wall.. Left ventricular ejection  fraction, by estimation, is 55 to 60%. The left ventricle has normal  function. Left ventricular diastolic parameters  were normal.   2. Right ventricular systolic function is normal. The right ventricular  size is normal. There is normal pulmonary artery systolic pressure.   3. The mitral valve is normal in structure. Trivial mitral valve  regurgitation.   4. The aortic valve is tricuspid. Aortic valve regurgitation is mild.    5. The inferior vena cava is dilated in size with <50% respiratory  variability, suggesting right atrial pressure of 15 mmHg.  _____________     ASSESSMENT AND PLAN:  1.  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 fortunately the vessel was open by the time we arrived in the cath lab with resolution of ST changes. Angiogram c/w SCAD. Will continue ASA, statin, beta blocker. No recurrent significant chest pain. Ecg looks good.    2.  Hyperlipidemia: Goal LDL of less than 70.  LDL 29 last year.  --will reduce lipitor to 20 mg daily    Disposition:   FU with me  in 12 months   Signed, Jourdain Guay [place], MD  07/29/2022 8:53 AM  [facility] [address], [place], [place], [address] Phone [phone], Fax [phone]

## 2022-07-29 ENCOUNTER — Ambulatory Visit: Payer: BC Managed Care – PPO | Attending: Cardiology | Admitting: Cardiology

## 2022-07-29 ENCOUNTER — Encounter: Payer: Self-pay | Admitting: Cardiology

## 2022-07-29 VITALS — BP 114/82 | HR 61 | Ht 64.0 in | Wt 111.8 lb

## 2022-07-29 DIAGNOSIS — I2542 Coronary artery dissection: Secondary | ICD-10-CM | POA: Diagnosis not present

## 2022-07-29 DIAGNOSIS — E7800 Pure hypercholesterolemia, unspecified: Secondary | ICD-10-CM | POA: Diagnosis not present

## 2022-07-29 MED ORDER — ATORVASTATIN CALCIUM 20 MG PO TABS: 20.0000 mg | ORAL_TABLET | Freq: Every day | ORAL | 3 refills | Status: DC

## 2022-07-29 MED ORDER — ASPIRIN 81 MG PO TBEC: 81.0000 mg | DELAYED_RELEASE_TABLET | Freq: Every day | ORAL | 3 refills | Status: DC

## 2022-07-29 MED ORDER — METOPROLOL SUCCINATE ER 25 MG PO TB24: 12.5000 mg | ORAL_TABLET | Freq: Every day | ORAL | 3 refills | Status: DC

## 2022-07-29 MED ORDER — NITROGLYCERIN 0.4 MG SL SUBL: 0.4000 mg | SUBLINGUAL_TABLET | SUBLINGUAL | 2 refills | Status: AC | PRN

## 2022-08-02 DIAGNOSIS — Z0000 Encounter for general adult medical examination without abnormal findings: Secondary | ICD-10-CM | POA: Diagnosis not present

## 2022-08-06 DIAGNOSIS — Z0000 Encounter for general adult medical examination without abnormal findings: Secondary | ICD-10-CM | POA: Diagnosis not present

## 2022-08-06 DIAGNOSIS — Z124 Encounter for screening for malignant neoplasm of cervix: Secondary | ICD-10-CM | POA: Diagnosis not present

## 2022-08-06 DIAGNOSIS — R7303 Prediabetes: Secondary | ICD-10-CM | POA: Diagnosis not present

## 2022-08-06 DIAGNOSIS — E782 Mixed hyperlipidemia: Secondary | ICD-10-CM | POA: Diagnosis not present

## 2022-08-06 DIAGNOSIS — I2542 Coronary artery dissection: Secondary | ICD-10-CM | POA: Diagnosis not present

## 2022-08-06 DIAGNOSIS — I252 Old myocardial infarction: Secondary | ICD-10-CM | POA: Diagnosis not present

## 2022-08-28 IMAGING — DX DG CHEST 1V PORT
1 series · 1 of 1 positions shown · non-contrast
Comparison: 07/01/2008

CLINICAL DATA: Chest pain

EXAM:
PORTABLE CHEST 1 VIEW

[chest ap]
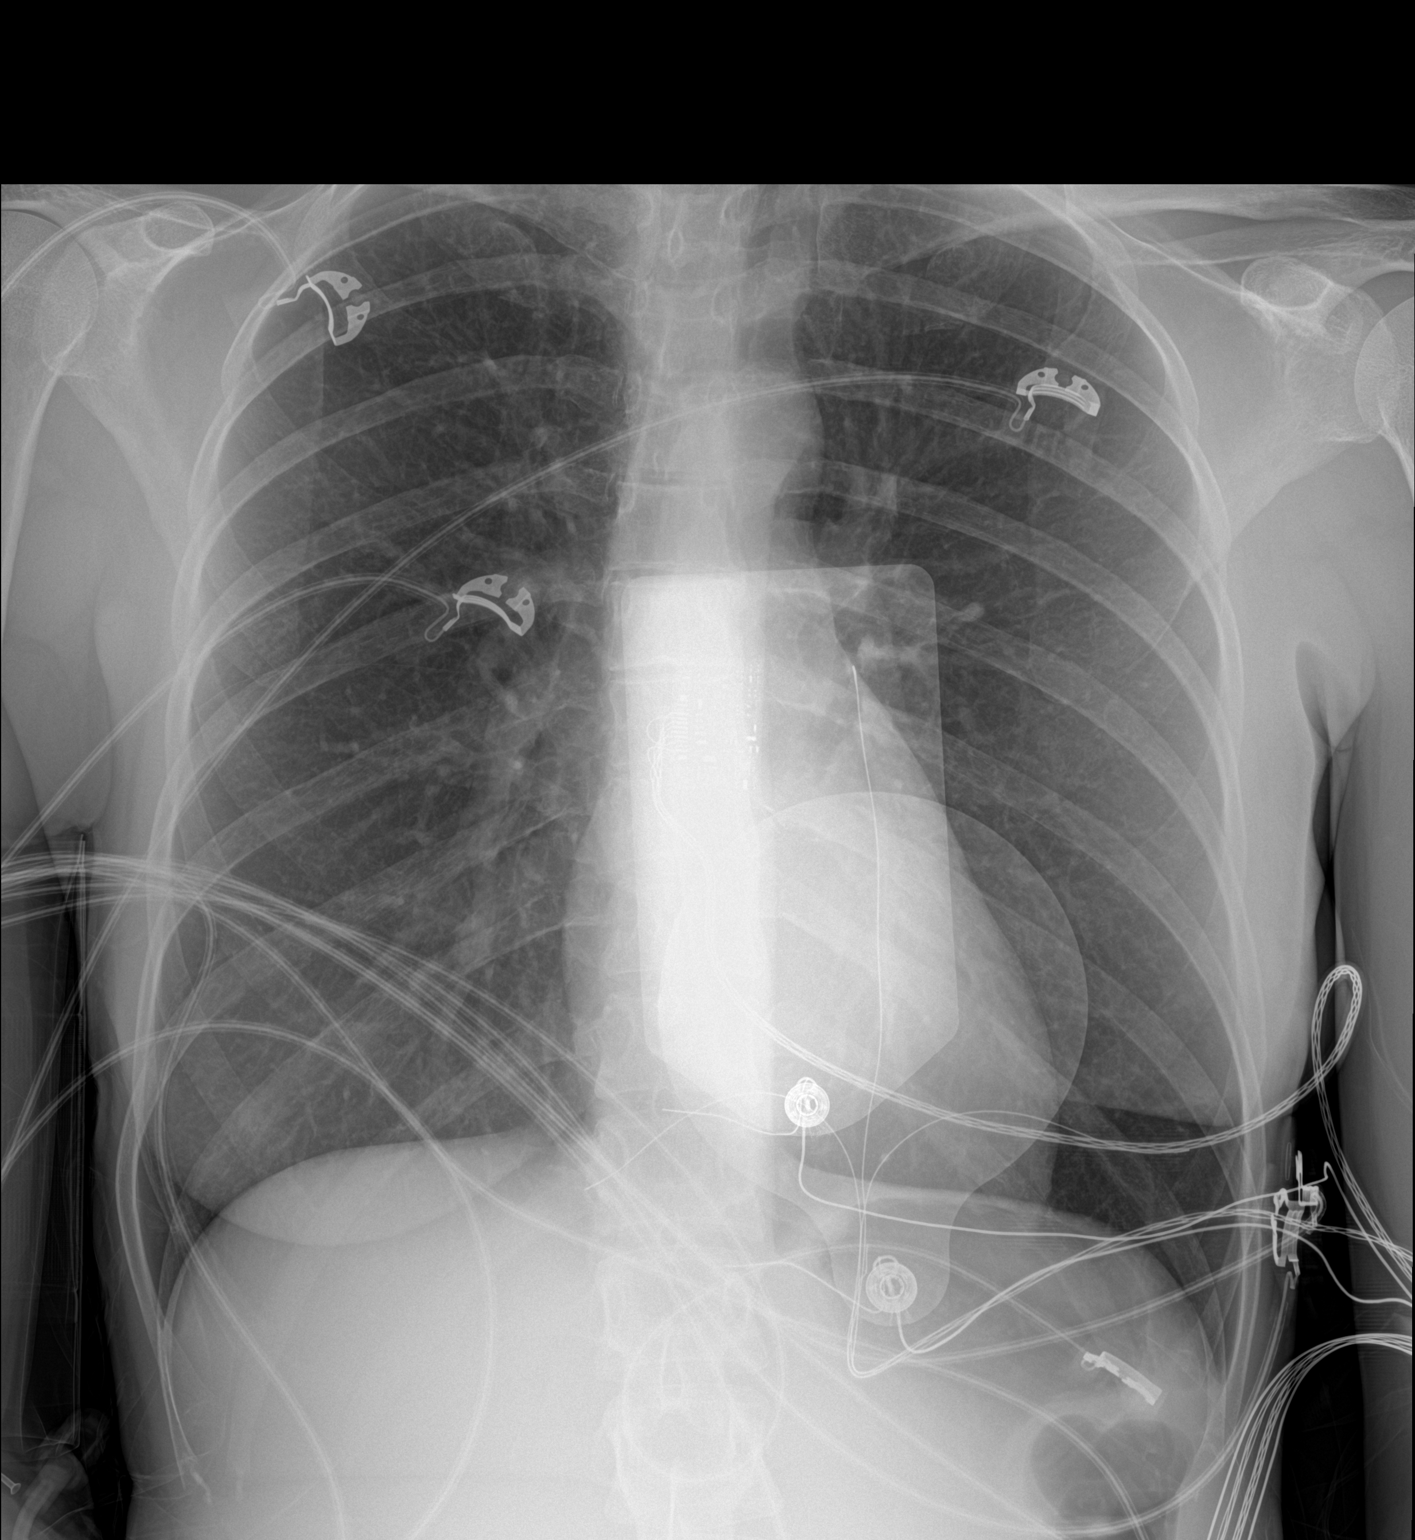

[1 of 1 positions shown; findings below may reference images not displayed]

FINDINGS: The heart size and mediastinal contours are within normal limits.
Both lungs are clear. The visualized skeletal structures are
unremarkable.
IMPRESSION: Negative

## 2022-09-05 DIAGNOSIS — Z1231 Encounter for screening mammogram for malignant neoplasm of breast: Secondary | ICD-10-CM | POA: Diagnosis not present

## 2022-11-18 DIAGNOSIS — S0501XA Injury of conjunctiva and corneal abrasion without foreign body, right eye, initial encounter: Secondary | ICD-10-CM | POA: Diagnosis not present

## 2022-11-18 DIAGNOSIS — H02052 Trichiasis without entropian right lower eyelid: Secondary | ICD-10-CM | POA: Diagnosis not present

## 2022-11-26 DIAGNOSIS — S0501XA Injury of conjunctiva and corneal abrasion without foreign body, right eye, initial encounter: Secondary | ICD-10-CM | POA: Diagnosis not present

## 2023-03-10 DIAGNOSIS — H04123 Dry eye syndrome of bilateral lacrimal glands: Secondary | ICD-10-CM | POA: Diagnosis not present

## 2023-03-10 DIAGNOSIS — Z83511 Family history of glaucoma: Secondary | ICD-10-CM | POA: Diagnosis not present

## 2023-03-10 DIAGNOSIS — H43393 Other vitreous opacities, bilateral: Secondary | ICD-10-CM | POA: Diagnosis not present

## 2023-03-10 DIAGNOSIS — R7303 Prediabetes: Secondary | ICD-10-CM | POA: Diagnosis not present

## 2023-07-22 ENCOUNTER — Other Ambulatory Visit: Payer: Self-pay | Admitting: Cardiology

## 2023-07-26 NOTE — Progress Notes (Signed)
Cardiology Office Note   Date:  07/31/2023   ID:  Haley Morton, DOB 11/08/1970, MRN [id number]  PCP:  Isabella Bowens, PA-C  Cardiologist:   Malin Cervini [place], MD   Chief Complaint  Patient presents with   Coronary Artery Disease      History of Present Illness: Haley Morton is a 53 y.o. female who presents for follow up SCAD. She presented  to [facility] in March 2022 with acute onset mid sternal chest pain with tingling radiating to her arms. Some nausea. Initial Ecg showed significant ST elevation in the inferolateral leads. Given ASA, IV heparin. Chest pain then resolved with resolution of ST elevation. Reported she has been under a lot of stress with marital issues.  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  Noted to have TIMI-3 flow at the time cath.  Low normal LV function with apical hypokinesis noted on LV gram. hsTn peaked at 1775. She was started on medical therapy with aspirin, statin and beta-blocker.  Carotid Dopplers were negative for fibromuscular dysplasia.  Follow-up echocardiogram showed an EF of 55 to 60% with akinesis in the distal inferior, anterior septal and apical walls. She had no recurrent chest pain.   On follow up today she is doing well. She is under a lot of stress. She is currently separated after 30 years of marriage with divorce to be finalized. She is still working 3 jobs. She does use elliptical daily. She does note some fatigue. Has a poor sleep schedule.   Past Medical History:  Diagnosis Date   Spontaneous dissection of coronary artery     Past Surgical History:  Procedure Laterality Date   LEFT HEART CATH AND CORONARY ANGIOGRAPHY N/A 10/01/2020   Procedure: LEFT HEART CATH AND CORONARY ANGIOGRAPHY;  Surgeon: [place], Kyeisha Janowicz M, MD;  Location: [facility] INVASIVE CV LAB;  Service: Cardiovascular;  Laterality: N/A;     Current Outpatient Medications  Medication Sig  Dispense Refill   aspirin EC (ASPIRIN LOW DOSE) 81 MG tablet Take 1 tablet (81 mg total) by mouth daily. Swallow whole. 90 tablet 3   atorvastatin (LIPITOR) 20 MG tablet TAKE 1 TABLET BY MOUTH EVERY DAY 90 tablet 0   Carboxymethylcellul-Glycerin (LUBRICATING EYE DROPS OP) Place 1 drop into both eyes 3 (three) times daily as needed (dry eyes).     nitroGLYCERIN (NITROSTAT) 0.4 MG SL tablet Place 1 tablet (0.4 mg total) under the tongue every 5 (five) minutes as needed for chest pain (x3prn). 25 tablet 2   No current facility-administered medications for this visit.    Allergies:   Patient has no known allergies.    Social History:  The patient  reports that she has never smoked. She has never used smokeless tobacco.   Family History:  The patient's family history is negative for CAD.    ROS:  Please see the history of present illness.   Otherwise, review of systems are positive for none.   All other systems are reviewed and negative.    PHYSICAL EXAM: VS:  BP 106/70 (BP Location: Left Arm, Patient Position: Sitting, Cuff Size: Normal)   Pulse 67   Ht 5\' 4"  (1.626 m)   Wt 106 lb (48.1 kg)   SpO2 97%   BMI 18.19 kg/m  , BMI Body mass index is 18.19 kg/m. GEN: Well nourished, thin, in no acute distress  HEENT: normal  Neck: no JVD, carotid bruits, or  masses Cardiac: RRR; no murmurs, rubs, or gallops,no edema  Respiratory:  clear to auscultation bilaterally, normal work of breathing GI: soft, nontender, nondistended, + BS MS: no deformity or atrophy  Skin: warm and dry, no rash Neuro:  Strength and sensation are intact Psych: euthymic mood, full affect   EKG Interpretation Date/Time:  Thursday July 31 2023 08:08:52 EST Ventricular Rate:  67 PR Interval:  196 QRS Duration:  82 QT Interval:  402 QTC Calculation: 424 R Axis:   80  Text Interpretation: Normal sinus rhythm  Normal ECG When compared with ECG of 03-Oct-2020 06:52, T wave inversion no longer evident in  Inferior leads T wave inversion no longer evident in Anterolateral leads QT has shortened   Confirmed by [place], Joan Herschberger [phone]) on 07/31/2023 8:15:28 AM      Recent Labs: No results found for requested labs within last 365 days.    Lipid Panel    Component Value Date/Time   CHOL 142 11/23/2020 0810   TRIG 49 11/23/2020 0810   HDL 98 11/23/2020 0810   CHOLHDL 1.4 11/23/2020 0810   CHOLHDL 2.0 10/02/2020 1157   VLDL 10 10/02/2020 1157   LDLCALC 33 11/23/2020 0810      Wt Readings from Last 3 Encounters:  07/31/23 106 lb (48.1 kg)  07/29/22 111 lb 12.8 oz (50.7 kg)  11/27/20 107 lb (48.5 kg)      Other studies Reviewed: Additional studies/ records that were reviewed today include:   Cath: 10/01/20    Mid LAD to Dist LAD lesion is 50% stenosed. The left ventricular systolic function is normal. LV end diastolic pressure is normal. The left ventricular ejection fraction is 50-55% by visual estimate.   1. 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 Now with TIMI 3 flow.  2. Low normal LV function with apical hypokinesis. 3. Normal LVEDP   Plan: will manage medically with ASA, statin, beta blocker. DVT prophylaxis. Observe closely in ICU tonight.  Diagnostic Dominance: Right    Echo: 10/02/20   IMPRESSIONS     1. Akinesis of the distal inferior, anteroseptal and apical walls;  hypokinesis of the distal anterior wall.. Left ventricular ejection  fraction, by estimation, is 55 to 60%. The left ventricle has normal  function. Left ventricular diastolic parameters  were normal.   2. Right ventricular systolic function is normal. The right ventricular  size is normal. There is normal pulmonary artery systolic pressure.   3. The mitral valve is normal in structure. Trivial mitral valve  regurgitation.   4. The aortic valve is tricuspid. Aortic valve regurgitation is mild.   5. The inferior vena cava is dilated in  size with <50% respiratory  variability, suggesting right atrial pressure of 15 mmHg.  _____________     ASSESSMENT AND PLAN:  1.  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 fortunately the vessel was open by the time we arrived in the cath lab with resolution of ST changes. Angiogram c/w SCAD. Will continue ASA, statin. Will discontinue Toprol given fatigue. No recurrent significant chest pain. Ecg looks good.    2.  Hyperlipidemia: Goal LDL of less than 70.  LDL 40 last year. Follow up labs next month with PCP     Disposition:   FU with me  in  one year   Signed, Linea Calles [place], MD  07/31/2023 8:25 AM    [facility] [address], [place],  [place], [address] Phone [phone], Fax [phone]

## 2023-07-30 ENCOUNTER — Ambulatory Visit: Payer: BC Managed Care – PPO | Admitting: Cardiology

## 2023-07-31 ENCOUNTER — Encounter: Payer: Self-pay | Admitting: Cardiology

## 2023-07-31 ENCOUNTER — Ambulatory Visit: Payer: BC Managed Care – PPO | Attending: Cardiology | Admitting: Cardiology

## 2023-07-31 VITALS — BP 106/70 | HR 67 | Ht 64.0 in | Wt 106.0 lb

## 2023-07-31 DIAGNOSIS — I2542 Coronary artery dissection: Secondary | ICD-10-CM | POA: Diagnosis not present

## 2023-07-31 DIAGNOSIS — E785 Hyperlipidemia, unspecified: Secondary | ICD-10-CM | POA: Diagnosis not present

## 2023-07-31 NOTE — Patient Instructions (Signed)
Medication Instructions:  Stop Toprol Continue all other medications *If you need a refill on your cardiac medications before your next appointment, please call your pharmacy*   Lab Work: None ordered   Testing/Procedures: None ordered   Follow-Up: At [facility], you and your health needs are our priority.  As part of our continuing mission to provide you with exceptional heart care, we have created designated Provider Care Teams.  These Care Teams include your primary Cardiologist (physician) and Advanced Practice Providers (APPs -  Physician Assistants and Nurse Practitioners) who all work together to provide you with the care you need, when you need it.  We recommend signing up for the patient portal called "MyChart".  Sign up information is provided on this After Visit Summary.  MyChart is used to connect with patients for Virtual Visits (Telemedicine).  Patients are able to view lab/test results, encounter notes, upcoming appointments, etc.  Non-urgent messages can be sent to your provider as well.   To learn more about what you can do with MyChart, go to [email or web address].    Your next appointment:  1 year   Call in Oct to schedule Jan appointment     Provider:  Dr.Jordan

## 2023-08-29 ENCOUNTER — Other Ambulatory Visit: Payer: Self-pay | Admitting: Cardiology

## 2023-08-29 DIAGNOSIS — Z0000 Encounter for general adult medical examination without abnormal findings: Secondary | ICD-10-CM | POA: Diagnosis not present

## 2023-09-02 DIAGNOSIS — Z0000 Encounter for general adult medical examination without abnormal findings: Secondary | ICD-10-CM | POA: Diagnosis not present

## 2023-09-02 DIAGNOSIS — E782 Mixed hyperlipidemia: Secondary | ICD-10-CM | POA: Diagnosis not present

## 2023-09-02 DIAGNOSIS — I252 Old myocardial infarction: Secondary | ICD-10-CM | POA: Diagnosis not present

## 2023-09-02 DIAGNOSIS — R7301 Impaired fasting glucose: Secondary | ICD-10-CM | POA: Diagnosis not present

## 2023-09-02 DIAGNOSIS — I2542 Coronary artery dissection: Secondary | ICD-10-CM | POA: Diagnosis not present

## 2023-09-10 DIAGNOSIS — H04301 Unspecified dacryocystitis of right lacrimal passage: Secondary | ICD-10-CM | POA: Diagnosis not present

## 2023-09-23 DIAGNOSIS — H04301 Unspecified dacryocystitis of right lacrimal passage: Secondary | ICD-10-CM | POA: Diagnosis not present

## 2023-09-23 DIAGNOSIS — I252 Old myocardial infarction: Secondary | ICD-10-CM | POA: Diagnosis not present

## 2023-09-23 DIAGNOSIS — H04551 Acquired stenosis of right nasolacrimal duct: Secondary | ICD-10-CM | POA: Diagnosis not present

## 2023-09-23 DIAGNOSIS — H02015 Cicatricial entropion of left lower eyelid: Secondary | ICD-10-CM | POA: Diagnosis not present

## 2023-09-23 DIAGNOSIS — I2542 Coronary artery dissection: Secondary | ICD-10-CM | POA: Diagnosis not present

## 2023-09-23 DIAGNOSIS — H02012 Cicatricial entropion of right lower eyelid: Secondary | ICD-10-CM | POA: Diagnosis not present

## 2023-10-05 ENCOUNTER — Other Ambulatory Visit: Payer: Self-pay | Admitting: Cardiology

## 2023-10-06 ENCOUNTER — Telehealth: Payer: Self-pay | Admitting: *Deleted

## 2023-10-06 ENCOUNTER — Telehealth: Payer: Self-pay

## 2023-10-06 NOTE — Telephone Encounter (Signed)
   Pre-operative Risk Assessment    Patient Name: Haley Morton  DOB: 1970/09/03 MRN: [id number]   Date of last office visit: 07/31/23 DR. [place] Date of next office visit: NONE   Request for Surgical Clearance    Procedure:   DACRYOCYSTORHINOSTOMY   (ENDOSCOPIC)  Date of Surgery:  Clearance TBD                                Surgeon:  DR. Osie Cheeks Surgeon's Group or Practice Name:  [facility] DEPT OF OPHTHALMOLOGY  Phone number:  [phone] Fax number:  [phone]   Type of Clearance Requested:   - Medical  - Pharmacy:  Hold Aspirin x 7-10 DAYS PRIOR   Type of Anesthesia:  Not Indicated   Additional requests/questions:    Elpidio Anis   10/06/2023, 12:26 PM

## 2023-10-06 NOTE — Telephone Encounter (Signed)
  Patient Consent for Virtual Visit        Haley Morton has provided verbal consent on 10/06/2023 for a virtual visit (video or telephone).   CONSENT FOR VIRTUAL VISIT FOR:  Haley Morton  By participating in this virtual visit I agree to the following:  I hereby voluntarily request, consent and authorize [facility] and its employed or contracted physicians, physician assistants, nurse practitioners or other licensed health care professionals (the Practitioner), to provide me with telemedicine health care services (the "Services") as deemed necessary by the treating Practitioner. I acknowledge and consent to receive the Services by the Practitioner via telemedicine. I understand that the telemedicine visit will involve communicating with the Practitioner through live audiovisual communication technology and the disclosure of certain medical information by electronic transmission. I acknowledge that I have been given the opportunity to request an in-person assessment or other available alternative prior to the telemedicine visit and am voluntarily participating in the telemedicine visit.  I understand that I have the right to withhold or withdraw my consent to the use of telemedicine in the course of my care at any time, without affecting my right to future care or treatment, and that the Practitioner or I may terminate the telemedicine visit at any time. I understand that I have the right to inspect all information obtained and/or recorded in the course of the telemedicine visit and may receive copies of available information for a reasonable fee.  I understand that some of the potential risks of receiving the Services via telemedicine include:  Delay or interruption in medical evaluation due to technological equipment failure or disruption; Information transmitted may not be sufficient (e.g. poor resolution of images) to allow for appropriate medical decision making by the Practitioner;  and/or  In rare instances, security protocols could fail, causing a breach of personal health information.  Furthermore, I acknowledge that it is my responsibility to provide information about my medical history, conditions and care that is complete and accurate to the best of my ability. I acknowledge that Practitioner's advice, recommendations, and/or decision may be based on factors not within their control, such as incomplete or inaccurate data provided by me or distortions of diagnostic images or specimens that may result from electronic transmissions. I understand that the practice of medicine is not an exact science and that Practitioner makes no warranties or guarantees regarding treatment outcomes. I acknowledge that a copy of this consent can be made available to me via my patient portal [facility] MyChart), or I can request a printed copy by calling the office of [facility].    I understand that my insurance will be billed for this visit.   I have read or had this consent read to me. I understand the contents of this consent, which adequately explains the benefits and risks of the Services being provided via telemedicine.  I have been provided ample opportunity to ask questions regarding this consent and the Services and have had my questions answered to my satisfaction. I give my informed consent for the services to be provided through the use of telemedicine in my medical care

## 2023-10-06 NOTE — Telephone Encounter (Signed)
 Primary Cardiologist:Peter Sw[place]il[place], MD   Preoperative team, please contact this patient and set up a phone call appointment for further preoperative risk assessment. Please obtain consent and complete medication review. Thank you for your help.   Ideally aspirin should be continued without interruption, however if the bleeding risk is too great, aspirin may be held for 5-7 days prior to surgery. Please resume aspirin post operatively when it is felt to be safe from a bleeding standpoint.   I also confirmed the patient resides in the state of [place]. As per [facility] Medical Board telemedicine laws, the patient must reside in the state in which the provider is licensed.   Levi Aland, NP-C  10/06/2023, 3:11 PM [address] Office [phone] Fax [phone]

## 2023-10-06 NOTE — Telephone Encounter (Signed)
 Spoke with patient who is agreeable to do a tele visit on 3/28 at 2:20 pm. Med rec and consent done.

## 2023-10-10 ENCOUNTER — Ambulatory Visit: Attending: General Practice

## 2023-10-10 ENCOUNTER — Encounter: Payer: Self-pay | Admitting: Physician Assistant

## 2023-10-10 DIAGNOSIS — Z01810 Encounter for preprocedural cardiovascular examination: Secondary | ICD-10-CM

## 2023-10-10 NOTE — Progress Notes (Signed)
 Virtual Visit via Telephone Note   Because of Kaiden Dardis co-morbid illnesses, she is at least at moderate risk for complications without adequate follow up.  This format is felt to be most appropriate for this patient at this time.  Due to technical limitations with video connection (technology), today's appointment will be conducted as an audio only telehealth visit, and Haley Morton verbally agreed to proceed in this manner.   All issues noted in this document were discussed and addressed.  No physical exam could be performed with this format.  Evaluation Performed:  Preoperative cardiovascular risk assessment _____________   Date:  10/10/2023   Patient ID:  Haley Morton, DOB March 24, 1971, MRN [id number] Patient Location:  Home Provider location:   Office  Primary Care Provider:  Isabella Bowens, PA-C Primary Cardiologist:  Peter [place], MD  Chief Complaint / Patient Profile   53 y.o. y/o female with a h/o scad and hyperlipidemia who is pending darcryocytorhinostomy (endoscopic) and presents today for telephonic preoperative cardiovascular risk assessment.  History of Present Illness    Haley Morton is a 53 y.o. female who presents via audio/video conferencing for a telehealth visit today.  Pt was last seen in cardiology clinic on 07/31/2023 by Dr. Sw[place]il[place].  At that time Haley Morton was doing well other than being under a lot of stress.  The patient is now pending procedure as outlined above. Since her last visit, she states that she is having surgery for a clogged tear duct and they are going through her nose to do this procedure.  She will need general anesthesia for this.  Other than that she has been doing well from a heart standpoint.  Her hands go numb sometimes and she think it could be due to carpal tunnel syndrome.  She also mentions that she had recent lab work last month and has a low white blood cell count.  After doing some digging it looks like she had a white cell count of 7  back in 2022 and then after heart attack it dropped to around 3.  Now is borderline low at 4.  Unsure what is causing low WBCs.  She is asked me to reach out to Dr. Sw[place]iland to see if it could have something to do with her MI.  She does use her elliptical almost daily.  She does exceed 4 METS on the DASI.  Ideally aspirin should be continued without interruption, however if the bleeding risk is too great, aspirin may be held for 5-7 days prior to surgery. Please resume aspirin post operatively when it is felt to be safe from a bleeding standpoint.    Past Medical History    Past Medical History:  Diagnosis Date   Spontaneous dissection of coronary artery    Past Surgical History:  Procedure Laterality Date   LEFT HEART CATH AND CORONARY ANGIOGRAPHY N/A 10/01/2020   Procedure: LEFT HEART CATH AND CORONARY ANGIOGRAPHY;  Surgeon: Swazil[place], Peter M, MD;  Location: [facility] INVASIVE CV LAB;  Service: Cardiovascular;  Laterality: N/A;    Allergies  No Known Allergies  Home Medications    Prior to Admission medications   Medication Sig Start Date End Date Taking? Authorizing Provider  ASPIRIN LOW DOSE 81 MG tablet TAKE 1 TABLET (81 MG TOTAL) BY MOUTH DAILY. SWALLOW WHOLE. 10/06/23   [place], Peter M, MD  atorvastatin (LIPITOR) 20 MG tablet TAKE 1 TABLET BY MOUTH EVERY DAY 07/23/23   [place], Peter M, MD  Carboxymethylcellul-Glycerin (LUBRICATING EYE DROPS OP)  Place 1 drop into both eyes 3 (three) times daily as needed (dry eyes).    [provider]  nitroGLYCERIN (NITROSTAT) 0.4 MG SL tablet Place 1 tablet (0.4 mg total) under the tongue every 5 (five) minutes as needed for chest pain (x3prn). 07/29/22   [place], Peter M, MD    Physical Exam    Vital Signs:  Haley Morton does not have vital signs available for review today.  Given telephonic nature of communication, physical exam is limited. AAOx3. NAD. Normal affect.  Speech and respirations are unlabored.  Accessory Clinical Findings     None  Assessment & Plan    1.  Preoperative Cardiovascular Risk Assessment:  Haley Morton perioperative risk of a major cardiac event is 0.9% according to the Revised Cardiac Risk Index (RCRI).  Therefore, she is at low risk for perioperative complications.   Her functional capacity is good at 5.62 METs according to the Duke Activity Status Index (DASI). Recommendations: According to ACC/AHA guidelines, no further cardiovascular testing needed.  The patient may proceed to surgery at acceptable risk.   Antiplatelet and/or Anticoagulation Recommendations: The patient should remain on Aspirin without interruption.   Can hold for 5 to 7 days if bleeding risk is determined to be too high.  Please resume a medically safe to do so.   The patient was advised that if she develops new symptoms prior to surgery to contact our office to arrange for a follow-up visit, and she verbalized understanding.   A copy of this note will be routed to requesting surgeon.  Time:   Today, I have spent 23 minutes with the patient with telehealth technology discussing medical history, symptoms, and management plan.     Sharlene Dory, PA-C  10/10/2023, 12:11 PM

## 2023-10-15 DIAGNOSIS — J329 Chronic sinusitis, unspecified: Secondary | ICD-10-CM | POA: Diagnosis not present

## 2023-10-15 DIAGNOSIS — H04551 Acquired stenosis of right nasolacrimal duct: Secondary | ICD-10-CM | POA: Diagnosis not present

## 2023-10-19 ENCOUNTER — Other Ambulatory Visit: Payer: Self-pay | Admitting: Cardiology

## 2023-12-05 DIAGNOSIS — I2542 Coronary artery dissection: Secondary | ICD-10-CM | POA: Diagnosis not present

## 2023-12-05 DIAGNOSIS — J340 Abscess, furuncle and carbuncle of nose: Secondary | ICD-10-CM | POA: Diagnosis not present

## 2023-12-05 DIAGNOSIS — H04551 Acquired stenosis of right nasolacrimal duct: Secondary | ICD-10-CM | POA: Diagnosis not present

## 2023-12-15 DIAGNOSIS — Z09 Encounter for follow-up examination after completed treatment for conditions other than malignant neoplasm: Secondary | ICD-10-CM | POA: Diagnosis not present

## 2024-01-27 DIAGNOSIS — Z09 Encounter for follow-up examination after completed treatment for conditions other than malignant neoplasm: Secondary | ICD-10-CM | POA: Diagnosis not present

## 2024-03-09 DIAGNOSIS — Z09 Encounter for follow-up examination after completed treatment for conditions other than malignant neoplasm: Secondary | ICD-10-CM | POA: Diagnosis not present

## 2024-03-09 DIAGNOSIS — Z9889 Other specified postprocedural states: Secondary | ICD-10-CM | POA: Diagnosis not present

## 2024-03-09 DIAGNOSIS — I252 Old myocardial infarction: Secondary | ICD-10-CM | POA: Diagnosis not present

## 2024-03-11 DIAGNOSIS — Z9889 Other specified postprocedural states: Secondary | ICD-10-CM | POA: Diagnosis not present

## 2024-03-11 DIAGNOSIS — H04123 Dry eye syndrome of bilateral lacrimal glands: Secondary | ICD-10-CM | POA: Diagnosis not present

## 2024-03-11 DIAGNOSIS — H43393 Other vitreous opacities, bilateral: Secondary | ICD-10-CM | POA: Diagnosis not present

## 2024-03-11 DIAGNOSIS — R7303 Prediabetes: Secondary | ICD-10-CM | POA: Diagnosis not present
